# Patient Record
Sex: Female | Born: 1995 | Race: Black or African American | Hispanic: No | Marital: Single | Smoking: Never smoker
Health system: Southern US, Community
[De-identification: ages and names within clinical notes are randomized; demographics above are authoritative.]

---
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
## 2013-09-26 ENCOUNTER — Emergency Department (HOSPITAL_COMMUNITY)
Admission: EM | Admit: 2013-09-26 | Discharge: 2013-09-26 | Discharge status: Absent without leave | Payer: Medicaid Other | Source: Home / Self Care

## 2014-10-15 ENCOUNTER — Emergency Department
Admit: 2014-10-15 | Disposition: A | Discharge status: Home / Self Care | Payer: Self-pay | Admitting: Emergency Medicine

## 2014-12-22 ENCOUNTER — Encounter: Payer: Self-pay | Admitting: Emergency Medicine

## 2014-12-22 ENCOUNTER — Other Ambulatory Visit: Payer: Self-pay

## 2014-12-22 DIAGNOSIS — Z720 Tobacco use: Secondary | ICD-10-CM | POA: Insufficient documentation

## 2014-12-22 DIAGNOSIS — Z3202 Encounter for pregnancy test, result negative: Secondary | ICD-10-CM | POA: Insufficient documentation

## 2014-12-22 DIAGNOSIS — N12 Tubulo-interstitial nephritis, not specified as acute or chronic: Secondary | ICD-10-CM | POA: Insufficient documentation

## 2014-12-22 LAB — LIPASE, BLOOD: LIPASE: 24 U/L (ref 22–51)

## 2014-12-22 LAB — URINALYSIS COMPLETE WITH MICROSCOPIC (ARMC ONLY)
Bilirubin Urine: NEGATIVE
Glucose, UA: NEGATIVE mg/dL
NITRITE: NEGATIVE
PH: 5 (ref 5.0–8.0)
PROTEIN: 100 mg/dL — AB
SPECIFIC GRAVITY, URINE: 1.038 — AB (ref 1.005–1.030)

## 2014-12-22 LAB — COMPREHENSIVE METABOLIC PANEL
ALK PHOS: 59 U/L (ref 38–126)
ALT: 16 U/L (ref 14–54)
AST: 23 U/L (ref 15–41)
Albumin: 4 g/dL (ref 3.5–5.0)
Anion gap: 11 (ref 5–15)
BUN: 13 mg/dL (ref 6–20)
CO2: 25 mmol/L (ref 22–32)
Calcium: 9 mg/dL (ref 8.9–10.3)
Chloride: 99 mmol/L — ABNORMAL LOW (ref 101–111)
Creatinine, Ser: 0.77 mg/dL (ref 0.44–1.00)
GFR calc non Af Amer: >60 mL/min (ref >60)
GLUCOSE: 124 mg/dL — AB (ref 65–99)
Potassium: 3.1 mmol/L — ABNORMAL LOW (ref 3.5–5.1)
SODIUM: 135 mmol/L (ref 135–145)
TOTAL PROTEIN: 7.9 g/dL (ref 6.5–8.1)
Total Bilirubin: 0.7 mg/dL (ref 0.3–1.2)

## 2014-12-22 LAB — CBC WITH DIFFERENTIAL/PLATELET
BASOS ABS: 0 10*3/uL (ref 0–0.1)
Basophils Relative: 0 %
Eosinophils Absolute: 0 10*3/uL (ref 0–0.7)
Eosinophils Relative: 0 %
HCT: 37.9 % (ref 35.0–47.0)
Hemoglobin: 12.8 g/dL (ref 12.0–16.0)
LYMPHS ABS: 0.6 10*3/uL — AB (ref 1.0–3.6)
LYMPHS PCT: 5 %
MCH: 30.2 pg (ref 26.0–34.0)
MCHC: 33.7 g/dL (ref 32.0–36.0)
MCV: 89.5 fL (ref 80.0–100.0)
Monocytes Absolute: 0.9 10*3/uL (ref 0.2–0.9)
Monocytes Relative: 7 %
NEUTROS PCT: 88 %
Neutro Abs: 11.4 10*3/uL — ABNORMAL HIGH (ref 1.4–6.5)
PLATELETS: 171 10*3/uL (ref 150–440)
RBC: 4.23 MIL/uL (ref 3.80–5.20)
RDW: 12.7 % (ref 11.5–14.5)
WBC: 13 10*3/uL — AB (ref 3.6–11.0)

## 2014-12-22 LAB — POCT PREGNANCY, URINE: Preg Test, Ur: NEGATIVE

## 2014-12-22 NOTE — ED Notes (Signed)
Pt arrived to the ED for complaints of lower abdominal pain x3 days. Pt states that she has been vomiting since today (3 times) and has no had a BM in 3 days. Pt reports taking Midol without relieve. Pt is AOx4 in no apparent distress.

## 2014-12-23 ENCOUNTER — Emergency Department
Admission: EM | Admit: 2014-12-23 | Discharge: 2014-12-23 | Disposition: A | Discharge status: Home / Self Care | Payer: Medicaid Other | Attending: Emergency Medicine | Admitting: Emergency Medicine

## 2014-12-23 DIAGNOSIS — N12 Tubulo-interstitial nephritis, not specified as acute or chronic: Secondary | ICD-10-CM

## 2014-12-23 MED ORDER — ONDANSETRON HCL 4 MG/2ML IJ SOLN
4.0000 mg | INTRAMUSCULAR | Status: AC
  Administered 2014-12-23: 4 mg via INTRAVENOUS

## 2014-12-23 MED ORDER — SODIUM CHLORIDE 0.9 % IV BOLUS (SEPSIS)
1000.0000 mL | INTRAVENOUS | Status: AC
  Administered 2014-12-23: 1000 mL via INTRAVENOUS

## 2014-12-23 MED ORDER — CEPHALEXIN 500 MG PO CAPS: 500.0000 mg | ORAL_CAPSULE | Freq: Three times a day (TID) | ORAL | Status: DC

## 2014-12-23 MED ORDER — CEFTRIAXONE SODIUM IN DEXTROSE 20 MG/ML IV SOLN
INTRAVENOUS | Status: AC
  Administered 2014-12-23: 1 g via INTRAVENOUS
  Filled 2014-12-23: qty 50

## 2014-12-23 MED ORDER — DOXYCYCLINE HYCLATE 100 MG PO TABS
100.0000 mg | ORAL_TABLET | Freq: Once | ORAL | Status: AC
  Administered 2014-12-23: 100 mg via ORAL

## 2014-12-23 MED ORDER — CEFTRIAXONE SODIUM IN DEXTROSE 20 MG/ML IV SOLN
1.0000 g | INTRAVENOUS | Status: AC
  Administered 2014-12-23: 1 g via INTRAVENOUS

## 2014-12-23 MED ORDER — ONDANSETRON HCL 4 MG PO TABS: ORAL_TABLET | ORAL | Status: DC

## 2014-12-23 MED ORDER — DOXYCYCLINE HYCLATE 50 MG PO CAPS: 100.0000 mg | ORAL_CAPSULE | Freq: Two times a day (BID) | ORAL | Status: DC

## 2014-12-23 MED ORDER — MORPHINE SULFATE 4 MG/ML IJ SOLN
4.0000 mg | Freq: Once | INTRAMUSCULAR | Status: AC
  Administered 2014-12-23: 4 mg via INTRAVENOUS

## 2014-12-23 MED ORDER — ONDANSETRON HCL 4 MG/2ML IJ SOLN
INTRAMUSCULAR | Status: AC
  Administered 2014-12-23: 4 mg via INTRAVENOUS
  Filled 2014-12-23: qty 2

## 2014-12-23 MED ORDER — MORPHINE SULFATE 4 MG/ML IJ SOLN
INTRAMUSCULAR | Status: AC
  Administered 2014-12-23: 4 mg via INTRAVENOUS
  Filled 2014-12-23: qty 1

## 2014-12-23 MED ORDER — DOXYCYCLINE HYCLATE 100 MG PO TABS
ORAL_TABLET | ORAL | Status: AC
  Administered 2014-12-23: 100 mg via ORAL
  Filled 2014-12-23: qty 1

## 2014-12-23 NOTE — ED Provider Notes (Addendum)
[facility] Emergency Department Provider Note  ____________________________________________  Time seen: Approximately 1:47 AM  I have reviewed the triage vital signs and the nursing notes.   HISTORY  Chief Complaint Abdominal Pain    HPI Margaret Crawford is a 19 y.o. female with no significant past medical history who reports gradual onset of 3 days of sharp lower abdominal pain that is persistent but waxing and waning in intensity.  She describes it as severe tonight.She developed nausea and vomiting today as well and believes she has had about 3 episodes of emesis.  Nothing makes it better and nothing makes it worse.  She has had some increased urinary frequency.  She denies chest pain, shortness of breath, fever/chills.  She has had some mild back pain associated with the abdominal pain.  She denies any vaginal discharge.  She states that she is sexually active with one partner and that they do not use condoms.   History reviewed. No pertinent past medical history.  There are no active problems to display for this patient.   History reviewed. No pertinent past surgical history.  Current Outpatient Rx  Name  Route  Sig  Dispense  Refill  . cephALEXin (KEFLEX) 500 MG capsule   Oral   Take 1 capsule (500 mg total) by mouth 3 (three) times daily.   36 capsule   0   . doxycycline (VIBRAMYCIN) 50 MG capsule   Oral   Take 2 capsules (100 mg total) by mouth 2 (two) times daily.   56 capsule   0   . ondansetron (ZOFRAN) 4 MG tablet      Take 1-2 tabs by mouth every 8 hours as needed for nausea/vomiting   30 tablet   0     Allergies Review of patient's allergies indicates no known allergies.  History reviewed. No pertinent family history.  Social History History  Substance Use Topics  . Smoking status: Light Tobacco Smoker  . Smokeless tobacco: Not on file  . Alcohol Use: No    Review of Systems Constitutional: No fever/chills Eyes:  No visual changes. ENT: No sore throat. Cardiovascular: Denies chest pain. Respiratory: Denies shortness of breath. Gastrointestinal: Severe sharp lower abdominal pain.  Several episodes of emesis.  No diarrhea.  No constipation. Genitourinary: Negative for dysuria.  No vaginal pain/discharge/bleeding Musculoskeletal: Negative for back pain. Skin: Negative for rash. Neurological: Negative for headaches, focal weakness or numbness.  10-point ROS otherwise negative.  ____________________________________________   PHYSICAL EXAM:  VITAL SIGNS: ED Triage Vitals  Enc Vitals Group     BP 12/22/14 2036 113/78 mmHg     Pulse Rate 12/22/14 2036 125     Resp 12/22/14 2036 20     Temp 12/22/14 2036 99.3 F (37.4 C)     Temp Source 12/22/14 2036 Oral     SpO2 12/22/14 2036 98 %     Weight 12/22/14 2036 99 lb 11.2 oz (45.224 kg)     Height 12/22/14 2036 5\' 4"  (1.626 m)     Head Cir --      Peak Flow --      Pain Score 12/22/14 2107 10     Pain Loc --      Pain Edu? --      Excl. in GC? --     Constitutional: Alert and oriented. Well appearing and in no acute distress.  Does appear uncomfortable Eyes: Conjunctivae are normal. PERRL. EOMI. Head: Atraumatic. Nose: No congestion/rhinnorhea. Mouth/Throat: Mucous membranes are  moist.  Oropharynx non-erythematous. Neck: No stridor.   Cardiovascular: Tachycardia, regular rhythm. Grossly normal heart sounds.  Good peripheral circulation. Respiratory: Normal respiratory effort.  No retractions. Lungs CTAB. Gastrointestinal: Soft and nontender. No distention. No abdominal bruits.  No CVA tenderness bilaterally, worse on right than left. Genitourinary: Deferred at the patient's request after explaining the reason for the exam (rule out STD) and the benefit of doing the exam today Musculoskeletal: No lower extremity tenderness nor edema.  No joint effusions. Neurologic:  Normal speech and language. No gross focal neurologic deficits are  appreciated. Speech is normal. Skin:  Skin is warm, dry and intact. No rash noted.   ____________________________________________   LABS (all labs ordered are listed, but only abnormal results are displayed)  Labs Reviewed  COMPREHENSIVE METABOLIC PANEL - Abnormal; Notable for the following:    Potassium 3.1 (*)    Chloride 99 (*)    Glucose, Bld 124 (*)    All other components within normal limits  CBC WITH DIFFERENTIAL/PLATELET - Abnormal; Notable for the following:    WBC 13.0 (*)    Neutro Abs 11.4 (*)    Lymphs Abs 0.6 (*)    All other components within normal limits  URINALYSIS COMPLETEWITH MICROSCOPIC ([facility] ONLY) - Abnormal; Notable for the following:    Color, Urine AMBER (*)    APPearance HAZY (*)    Ketones, ur 1+ (*)    Specific Gravity, Urine 1.038 (*)    Hgb urine dipstick 1+ (*)    Protein, ur 100 (*)    Leukocytes, UA 1+ (*)    Bacteria, UA RARE (*)    Squamous Epithelial / LPF 6-30 (*)    All other components within normal limits  URINE CULTURE  LIPASE, BLOOD  POC URINE PREG, ED  POCT PREGNANCY, URINE  Urine WBCs = TNTC ____________________________________________  EKG  ED ECG REPORT I, Madelaine Whipple, the attending physician, personally viewed and interpreted this ECG.  Date: 12/23/2014 EKG Time: 21:05 Rate: 106 Rhythm: Sinus tachycardia QRS Axis: normal Intervals: normal ST/T Wave abnormalities: Inverted T-wave in lead 3 and lead V3, otherwise unremarkable Conduction Disutrbances: none Narrative Interpretation: unremarkable  ____________________________________________  RADIOLOGY  Not indicated  ____________________________________________   PROCEDURES  Procedure(s) performed: None  Critical Care performed: No ____________________________________________   INITIAL IMPRESSION / ASSESSMENT AND PLAN / ED COURSE  Pertinent labs & imaging results that were available during my care of the patient were reviewed by me and considered in  my medical decision making (see chart for details).  The patient has a strongly positive urinalysis and her symptoms are consistent with a UTI/pyelonephritis.  Mild leukocytosis and tachycardia as well as mild CVA tenderness.  I explained the recommendation to do a pelvic exam to rule out STDs even though her urinalysis does strongly suggest her suprapubic pain and other symptoms being the result of a UTI.  She does not want to do the exam tonight though I did explain the benefit of doing the exam.  Since she has had no vaginal symptoms and we have an alternate diagnosis, I believe this is appropriate and I will respect her wishes.  The plan of care is for ceftriaxone 1 g IV and 2 L of IV fluids.  I will then reassess her and determine her appropriateness for discharge.  ----------------------------------------- 3:29 AM on 12/23/2014 -----------------------------------------  The patient's heart rate is down below 100 and she states that she feels better.  She is asking for something to drink.  I again discussed the pelvic exam with her and she again declines.  Given the risk of possible pelvic inflammatory disease, I will treat empirically with doxycycline 100 mg twice a day 14 days as well as treating with Keflex 500 mg 3 times a day 12 days for possible pyelonephritis.  I am also giving the patient a prescription for Zofran.  I gave her my usual and customary return precautions should she develop new symptoms or should she get worse. ____________________________________________  FINAL CLINICAL IMPRESSION(S) / ED DIAGNOSES  Final diagnoses:  Pyelonephritis      NEW MEDICATIONS STARTED DURING THIS VISIT:  New Prescriptions   CEPHALEXIN (KEFLEX) 500 MG CAPSULE    Take 1 capsule (500 mg total) by mouth 3 (three) times daily.   DOXYCYCLINE (VIBRAMYCIN) 50 MG CAPSULE    Take 2 capsules (100 mg total) by mouth 2 (two) times daily.   ONDANSETRON (ZOFRAN) 4 MG TABLET    Take 1-2 tabs by mouth  every 8 hours as needed for nausea/vomiting     Loleta Rose, MD 12/23/14 [phone]

## 2014-12-23 NOTE — Discharge Instructions (Signed)
Your workup today suggests that you have a urinary tract infection (UTI) which have may spread to your kidneys.  Please take your antibiotics as prescribed FOR THE FULL COURSE OF TREATMENT (until all the pills are gone) and over-the-counter pain medication (Tylenol or Motrin) as needed, but no more than recommended on the label instructions.  Drink PLENTY of fluids.  Use the prescribed Zofran as needed for nausea/vomiting.  Call your regular doctor to schedule the next available appointment to follow up on todays ED visit, or return immediately to the ED if your pain worsens, you have decreased urine production, develop fever, persistent vomiting, or other symptoms that concern you.   Pyelonephritis, Adult Pyelonephritis is a kidney infection. A kidney infection can happen quickly, or it can last for a long time. HOME CARE   Take your medicine (antibiotics) as told. Finish it even if you start to feel better.  Keep all doctor visits as told.  Drink enough fluids to keep your pee (urine) clear or pale yellow.  Only take medicine as told by your doctor. GET HELP RIGHT AWAY IF:   You have a fever or lasting symptoms for more than 2-3 days.  You have a fever and your symptoms suddenly get worse.  You cannot take your medicine or drink fluids as told.  You have chills and shaking.  You feel very weak or pass out (faint).  You do not feel better after 2 days. MAKE SURE YOU:  Understand these instructions.  Will watch your condition.  Will get help right away if you are not doing well or get worse. Document Released: 07/24/2004 Document Revised: 12/16/2011 Document Reviewed: 12/04/2010 [facility] Patient Information 2015 [place], [place]. This information is not intended to replace advice given to you by your health care provider. Make sure you discuss any questions you have with your health care provider.

## 2014-12-24 LAB — URINE CULTURE
Culture: 10000
Special Requests: NORMAL

## 2017-10-06 ENCOUNTER — Emergency Department
Admission: EM | Admit: 2017-10-06 | Discharge: 2017-10-06 | Disposition: A | Discharge status: Home / Self Care | Payer: Self-pay | Attending: Student in an Organized Health Care Education/Training Program | Admitting: Student in an Organized Health Care Education/Training Program

## 2017-10-06 ENCOUNTER — Encounter: Payer: Self-pay | Admitting: Emergency Medicine

## 2017-10-06 ENCOUNTER — Other Ambulatory Visit: Payer: Self-pay

## 2017-10-06 DIAGNOSIS — F17200 Nicotine dependence, unspecified, uncomplicated: Secondary | ICD-10-CM | POA: Insufficient documentation

## 2017-10-06 DIAGNOSIS — R197 Diarrhea, unspecified: Secondary | ICD-10-CM | POA: Insufficient documentation

## 2017-10-06 DIAGNOSIS — Z79899 Other long term (current) drug therapy: Secondary | ICD-10-CM | POA: Insufficient documentation

## 2017-10-06 DIAGNOSIS — R112 Nausea with vomiting, unspecified: Secondary | ICD-10-CM | POA: Insufficient documentation

## 2017-10-06 LAB — CBC
HEMATOCRIT: 42.5 % (ref 35.0–47.0)
Hemoglobin: 14 g/dL (ref 12.0–16.0)
MCH: 29.9 pg (ref 26.0–34.0)
MCHC: 33 g/dL (ref 32.0–36.0)
MCV: 90.4 fL (ref 80.0–100.0)
Platelets: 255 10*3/uL (ref 150–440)
RBC: 4.7 MIL/uL (ref 3.80–5.20)
RDW: 14.6 % — ABNORMAL HIGH (ref 11.5–14.5)
WBC: 10.7 10*3/uL (ref 3.6–11.0)

## 2017-10-06 LAB — COMPREHENSIVE METABOLIC PANEL
ALBUMIN: 4.9 g/dL (ref 3.5–5.0)
ALT: 16 U/L (ref 14–54)
AST: 25 U/L (ref 15–41)
Alkaline Phosphatase: 74 U/L (ref 38–126)
Anion gap: 6 (ref 5–15)
BUN: 21 mg/dL — ABNORMAL HIGH (ref 6–20)
CHLORIDE: 104 mmol/L (ref 101–111)
CO2: 27 mmol/L (ref 22–32)
Calcium: 9.3 mg/dL (ref 8.9–10.3)
Creatinine, Ser: 0.96 mg/dL (ref 0.44–1.00)
GFR calc Af Amer: >60 mL/min (ref >60)
GFR calc non Af Amer: >60 mL/min (ref >60)
GLUCOSE: 89 mg/dL (ref 65–99)
POTASSIUM: 3.6 mmol/L (ref 3.5–5.1)
SODIUM: 137 mmol/L (ref 135–145)
Total Bilirubin: 0.7 mg/dL (ref 0.3–1.2)
Total Protein: 8.7 g/dL — ABNORMAL HIGH (ref 6.5–8.1)

## 2017-10-06 LAB — LIPASE, BLOOD: Lipase: 27 U/L (ref 11–51)

## 2017-10-06 LAB — POCT PREGNANCY, URINE: PREG TEST UR: NEGATIVE

## 2017-10-06 LAB — URINALYSIS, COMPLETE (UACMP) WITH MICROSCOPIC
Bilirubin Urine: NEGATIVE
Glucose, UA: NEGATIVE mg/dL
Ketones, ur: 20 mg/dL — AB
Nitrite: NEGATIVE
Protein, ur: NEGATIVE mg/dL
SPECIFIC GRAVITY, URINE: 1.028 (ref 1.005–1.030)
pH: 5 (ref 5.0–8.0)

## 2017-10-06 MED ORDER — NITROFURANTOIN MONOHYD MACRO 100 MG PO CAPS: 100.0000 mg | ORAL_CAPSULE | Freq: Two times a day (BID) | ORAL | 0 refills | Status: AC

## 2017-10-06 MED ORDER — ONDANSETRON 4 MG PO TBDP
4.0000 mg | ORAL_TABLET | Freq: Once | ORAL | Status: AC
  Administered 2017-10-06: 4 mg via ORAL
  Filled 2017-10-06: qty 1

## 2017-10-06 MED ORDER — ONDANSETRON HCL 4 MG PO TABS: 4.0000 mg | ORAL_TABLET | Freq: Every day | ORAL | 0 refills | Status: DC | PRN

## 2017-10-06 NOTE — ED Triage Notes (Signed)
Pt to ED via POV with c/o vomiting and diarrhea that started this am. PT ambulatory, VSS.

## 2017-10-06 NOTE — ED Provider Notes (Signed)
[facility] Emergency Department Provider Note    First MD Initiated Contact with Patient 10/06/17 1816     (approximate)  I have reviewed the triage vital signs and the nursing notes.   HISTORY  Chief Complaint Emesis    HPI Margaret Crawford is a 22 y.o. female presents with chief complaint of 1 day of nausea vomiting diarrhea.  No blood hematemesis or melena.  Has had decreased appetite today.  No abdominal pain at this time.  Has not tried anything this afternoon.  Denies any dysuria, increased frequency or hematuria.  No vaginal discharge or pelvic pain.  No known sick contacts.  Has not had any episodes of vomiting since presented to the ER.  History reviewed. No pertinent past medical history. No family history on file. History reviewed. No pertinent surgical history. There are no active problems to display for this patient.     Prior to Admission medications   Medication Sig Start Date End Date Taking? Authorizing Provider  cephALEXin (KEFLEX) 500 MG capsule Take 1 capsule (500 mg total) by mouth 3 (three) times daily. 12/23/14   Loleta Rose, MD  doxycycline (VIBRAMYCIN) 50 MG capsule Take 2 capsules (100 mg total) by mouth 2 (two) times daily. 12/23/14   Loleta Rose, MD  ondansetron [facility]) 4 MG tablet Take 1-2 tabs by mouth every 8 hours as needed for nausea/vomiting 12/23/14   Loleta Rose, MD    Allergies Patient has no known allergies.    Social History Social History   Tobacco Use  . Smoking status: Light Tobacco Smoker  . Smokeless tobacco: Never Used  Substance Use Topics  . Alcohol use: Yes    Comment: occas  . Drug use: No    Review of Systems Patient denies headaches, rhinorrhea, blurry vision, numbness, shortness of breath, chest pain, edema, cough, abdominal pain, nausea, vomiting, diarrhea, dysuria, fevers, rashes or hallucinations unless otherwise stated above in  HPI. ____________________________________________   PHYSICAL EXAM:  VITAL SIGNS: Vitals:   10/06/17 1607  BP: 118/80  Pulse: 86  Resp: 16  Temp: 98.3 F (36.8 C)  SpO2: 98%    Constitutional: Alert and oriented. Well appearing and in no acute distress. Eyes: Conjunctivae are normal.  Head: Atraumatic. Nose: No congestion/rhinnorhea. Mouth/Throat: Mucous membranes are moist.   Neck: Painless ROM.  Cardiovascular:   Good peripheral circulation. Respiratory: Normal respiratory effort.  No retractions.  Gastrointestinal: Soft and nontender.  Musculoskeletal: No lower extremity tenderness .  No joint effusions. Neurologic:  Normal speech and language. No gross focal neurologic deficits are appreciated.  Skin:  Skin is warm, dry and intact. No rash noted. Psychiatric: Mood and affect are normal. Speech and behavior are normal.  ____________________________________________   LABS (all labs ordered are listed, but only abnormal results are displayed)  Results for orders placed or performed during the hospital encounter of 10/06/17 (from the past 24 hour(s))  Lipase, blood     Status: None   Collection Time: 10/06/17  4:10 PM  Result Value Ref Range   Lipase 27 11 - 51 U/L  Comprehensive metabolic panel     Status: Abnormal   Collection Time: 10/06/17  4:10 PM  Result Value Ref Range   Sodium 137 135 - 145 mmol/L   Potassium 3.6 3.5 - 5.1 mmol/L   Chloride 104 101 - 111 mmol/L   CO2 27 22 - 32 mmol/L   Glucose, Bld 89 65 - 99 mg/dL   BUN 21 (H) 6 -  20 mg/dL   Creatinine, Ser 1.61 0.44 - 1.00 mg/dL   Calcium 9.3 8.9 - 09.6 mg/dL   Total Protein 8.7 (H) 6.5 - 8.1 g/dL   Albumin 4.9 3.5 - 5.0 g/dL   AST 25 15 - 41 U/L   ALT 16 14 - 54 U/L   Alkaline Phosphatase 74 38 - 126 U/L   Total Bilirubin 0.7 0.3 - 1.2 mg/dL   GFR calc non Af Amer >60 >60 mL/min   GFR calc Af Amer >60 >60 mL/min   Anion gap 6 5 - 15  CBC     Status: Abnormal   Collection Time: 10/06/17  4:10 PM   Result Value Ref Range   WBC 10.7 3.6 - 11.0 K/uL   RBC 4.70 3.80 - 5.20 MIL/uL   Hemoglobin 14.0 12.0 - 16.0 g/dL   HCT 04.5 40.9 - 81.1 %   MCV 90.4 80.0 - 100.0 fL   MCH 29.9 26.0 - 34.0 pg   MCHC 33.0 32.0 - 36.0 g/dL   RDW 91.4 (H) 78.2 - 95.6 %   Platelets 255 150 - 440 K/uL  Urinalysis, Complete w Microscopic     Status: Abnormal   Collection Time: 10/06/17  4:10 PM  Result Value Ref Range   Color, Urine YELLOW (A) YELLOW   APPearance HAZY (A) CLEAR   Specific Gravity, Urine 1.028 1.005 - 1.030   pH 5.0 5.0 - 8.0   Glucose, UA NEGATIVE NEGATIVE mg/dL   Hgb urine dipstick MODERATE (A) NEGATIVE   Bilirubin Urine NEGATIVE NEGATIVE   Ketones, ur 20 (A) NEGATIVE mg/dL   Protein, ur NEGATIVE NEGATIVE mg/dL   Nitrite NEGATIVE NEGATIVE   Leukocytes, UA TRACE (A) NEGATIVE   RBC / HPF 0-5 0 - 5 RBC/hpf   WBC, UA 6-30 0 - 5 WBC/hpf   Bacteria, UA RARE (A) NONE SEEN   Squamous Epithelial / LPF 0-5 (A) NONE SEEN   Mucus PRESENT   Pregnancy, urine POC     Status: None   Collection Time: 10/06/17  4:19 PM  Result Value Ref Range   Preg Test, Ur NEGATIVE NEGATIVE   ____________________________________________  ____________________________________________  RADIOLOGY   ____________________________________________   PROCEDURES  Procedure(s) performed:  Procedures    Critical Care performed: no ____________________________________________   INITIAL IMPRESSION / ASSESSMENT AND PLAN / ED COURSE  Pertinent labs & imaging results that were available during my care of the patient were reviewed by me and considered in my medical decision making (see chart for details).  DDX: Enteritis, gastritis, UTI, Pilo, pregnancy dehydration  Margaret Crawford is a 22 y.o. who presents to the ED with symptoms as described above.  Patient well-appearing and nontoxic.  Have offered IV fluids but patient declined this.  Blood work is otherwise reassuring.  Possible bacteria in urine but  also possibly contaminant.  Certainly not clinically consistent with pyelonephritis.  Pregnancy is negative.  Patient already requesting discharge home.  At this point it would reasonable give her workand can rest.  Discussed need to return to the ER in 12-24 hours if symptoms worsen or for any additional questions or concerns.  Have discussed with the patient and available family all diagnostics and treatments performed thus far and all questions were answered to the best of my ability. The patient demonstrates understanding and agreement with plan.       ____________________________________________   FINAL CLINICAL IMPRESSION(S) / ED DIAGNOSES  Final diagnoses:  Nausea vomiting and diarrhea  NEW MEDICATIONS STARTED DURING THIS VISIT:  New Prescriptions   No medications on file     Note:  This document was prepared using Dragon voice recognition software and may include unintentional dictation errors.     Willy Eddyobinson, Ellinor Test, MD 10/06/17 [phone]

## 2018-04-04 ENCOUNTER — Emergency Department
Admission: EM | Admit: 2018-04-04 | Discharge: 2018-04-04 | Disposition: A | Discharge status: Home / Self Care | Payer: Self-pay | Attending: Emergency Medicine | Admitting: Emergency Medicine

## 2018-04-04 ENCOUNTER — Emergency Department: Payer: Self-pay

## 2018-04-04 DIAGNOSIS — M79671 Pain in right foot: Secondary | ICD-10-CM | POA: Insufficient documentation

## 2018-04-04 DIAGNOSIS — F17200 Nicotine dependence, unspecified, uncomplicated: Secondary | ICD-10-CM | POA: Insufficient documentation

## 2018-04-04 MED ORDER — NAPROXEN 375 MG PO TABS: 375.0000 mg | ORAL_TABLET | Freq: Two times a day (BID) | ORAL | 0 refills | Status: DC

## 2018-04-04 NOTE — ED Notes (Signed)

## 2018-04-04 NOTE — ED Triage Notes (Signed)
Pt presents via POV c/o right foot pain x3 days. Denies injury. Ambulatory to triage with slight limp.

## 2018-04-04 NOTE — Discharge Instructions (Addendum)
You have been diagnosed with acute right foot pain.  The x-ray of your right foot is negative for fracture or any acute findings.  I have provided you with a prescription for Naproxen to take twice a day for the next week.  Please take with food.  Avoid all other over-the-counter NSAIDs.  Ice may be helpful to aid in reducing pain.  Follow-up with the [facility] as needed if pain persist or worsens.

## 2018-04-04 NOTE — ED Provider Notes (Signed)
[facility] Emergency Department Provider Note ____________________________________________  Time seen: 1530  I have reviewed the triage vital signs and the nursing notes.  HISTORY  Chief Complaint  Foot Pain   HPI Margaret Crawford is a 22 y.o. female presents to the ER today with complaint of right foot pain.  She reports this started 2 to 3 days ago.  She reports the pain came out of nowhere.  She describes the pain as a sharp and stabbing.  The pain is worse with palpation and ambulation.  She has not noticed any swelling or bruising over the area.  She denies any injury to the area.  She denies numbness, tingling or weakness of the right lower extremity.  She has not tried anything over-the-counter for this.  History reviewed. No pertinent past medical history.  There are no active problems to display for this patient.   History reviewed. No pertinent surgical history.  Prior to Admission medications   Medication Sig Start Date End Date Taking? Authorizing Provider  naproxen (NAPROSYN) 375 MG tablet Take 1 tablet (375 mg total) by mouth 2 (two) times daily with a meal. 04/04/18   Dayanara Sherrill, Salvadore Oxford, NP    Allergies Patient has no known allergies.  History reviewed. No pertinent family history.  Social History Social History   Tobacco Use  . Smoking status: Light Tobacco Smoker  . Smokeless tobacco: Never Used  Substance Use Topics  . Alcohol use: Yes    Comment: occas  . Drug use: No    Review of Systems  Constitutional: Negative for fever. Musculoskeletal: Positive for right foot pain.  Negative for knee or ankle pain. Skin: Negative for swelling, bruising or redness. Neurological: Negative for focal weakness, tingling or numbness. ____________________________________________  PHYSICAL EXAM:  VITAL SIGNS: ED Triage Vitals  Enc Vitals Group     BP 04/04/18 1433 123/71     Pulse Rate 04/04/18 1433 84     Resp 04/04/18 1433 14   Temp 04/04/18 1433 98.1 F (36.7 C)     Temp Source 04/04/18 1433 Oral     SpO2 04/04/18 1433 100 %     Weight 04/04/18 1434 107 lb (48.5 kg)     Height --      Head Circumference --      Peak Flow --      Pain Score 04/04/18 1434 10     Pain Loc --      Pain Edu? --      Excl. in GC? --     Constitutional: Alert and oriented. Well appearing and in no distress. Cardiovascular: Pedal pulses 2+ bilaterally Musculoskeletal: Normal flexion, extension and rotation of the right ankle.  Normal flexion and extension of the toes.  No pain with resistance to flexion and extension of the toes.  No pain with palpation of the Achilles or bilateral malleoli.  Pain with palpation over the second cuneiform bone.  No joint swelling noted.  Gait slow with a slight limp. Neurologic: Sensation intact to LE.  No gross focal neurologic deficits are appreciated. Skin: Feet are cool, but dry and intact.  No redness, swelling or bruising noted. ____________________________________________   RADIOLOGY   Imaging Orders     DG Foot Complete Right IMPRESSION: Normal exam.  ____________________________________________   INITIAL IMPRESSION / ASSESSMENT AND PLAN / ED COURSE  Acute Right Foot Pain:  Xray negative for acute findings Will treat for inflammation of 2nd cuneiform RX provided for Naproxen 375 mg BID  x 1 week Pt request ACE wrap- applied Ice for 10 minutes BID  Work note provided to be off work for 3 days  ____________________________________________  FINAL CLINICAL IMPRESSION(S) / ED DIAGNOSES  Final diagnoses:  Acute pain of right foot      Lorre Munroe, NP 04/04/18 1547    Arnaldo Natal, MD 04/05/18 [phone]

## 2019-03-15 ENCOUNTER — Emergency Department
Admission: EM | Admit: 2019-03-15 | Discharge: 2019-03-15 | Disposition: A | Discharge status: Home / Self Care | Payer: Self-pay | Attending: Emergency Medicine | Admitting: Emergency Medicine

## 2019-03-15 ENCOUNTER — Other Ambulatory Visit: Payer: Self-pay

## 2019-03-15 DIAGNOSIS — F17200 Nicotine dependence, unspecified, uncomplicated: Secondary | ICD-10-CM | POA: Insufficient documentation

## 2019-03-15 DIAGNOSIS — J029 Acute pharyngitis, unspecified: Secondary | ICD-10-CM

## 2019-03-15 DIAGNOSIS — R070 Pain in throat: Secondary | ICD-10-CM | POA: Insufficient documentation

## 2019-03-15 DIAGNOSIS — Z20828 Contact with and (suspected) exposure to other viral communicable diseases: Secondary | ICD-10-CM | POA: Insufficient documentation

## 2019-03-15 MED ORDER — MAGIC MOUTHWASH W/LIDOCAINE: 5.0000 mL | Freq: Four times a day (QID) | ORAL | 0 refills | Status: DC

## 2019-03-15 NOTE — ED Triage Notes (Addendum)
Presents with sore throat for couple of days   No fever  States her empolyer sent her home d/t sore throat  States she needed to be cleared

## 2019-03-15 NOTE — ED Provider Notes (Signed)
[facility] Emergency Department Provider Note  ____________________________________________  Time seen: Approximately 3:38 PM  I have reviewed the triage vital signs and the nursing notes.   HISTORY  Chief Complaint Sore Throat    HPI Margaret Crawford is a 23 y.o. female who presents the emergency department for evaluation of sore throat.  Patient reports that she recently has "head colds."  Patient started with sore throat yesterday.   Patient reports that she has had no associated nasal congestion, fevers or chills, coughing, shortness of breath.  Patient reports that her work is requiring her to have a COVID-19 test and given her sore throat status.  No medications prior to arrival.  No other complaints at this time.        No past medical history on file.  There are no active problems to display for this patient.   No past surgical history on file.  Prior to Admission medications   Medication Sig Start Date End Date Taking? Authorizing Provider  magic mouthwash w/lidocaine SOLN Take 5 mLs by mouth 4 (four) times daily. 03/15/19   Tamre Cass, Delorise RoyalsJonathan D, PA-C    Allergies Patient has no known allergies.  No family history on file.  Social History Social History   Tobacco Use  . Smoking status: Light Tobacco Smoker  . Smokeless tobacco: Never Used  Substance Use Topics  . Alcohol use: Yes    Comment: occas  . Drug use: No     Review of Systems  Constitutional: No fever/chills Eyes: No visual changes. No discharge ENT: Positive for sore throat Cardiovascular: no chest pain. Respiratory: no cough. No SOB. Gastrointestinal: No abdominal pain.  No nausea, no vomiting.  No diarrhea.  No constipation. Musculoskeletal: Negative for musculoskeletal pain. Skin: Negative for rash, abrasions, lacerations, ecchymosis. Neurological: Negative for headaches, focal weakness or numbness. 10-point ROS otherwise  negative.  ____________________________________________   PHYSICAL EXAM:  VITAL SIGNS: ED Triage Vitals  Enc Vitals Group     BP 03/15/19 1533 114/80     Pulse Rate 03/15/19 1533 90     Resp 03/15/19 1533 17     Temp 03/15/19 1533 (!) 97.5 F (36.4 C)     Temp Source 03/15/19 1533 Oral     SpO2 03/15/19 1533 100 %     Weight 03/15/19 1527 116 lb (52.6 kg)     Height 03/15/19 1527 5\' 4"  (1.626 m)     Head Circumference --      Peak Flow --      Pain Score 03/15/19 1531 4     Pain Loc --      Pain Edu? --      Excl. in GC? --      Constitutional: Alert and oriented. Well appearing and in no acute distress. Eyes: Conjunctivae are normal. PERRL. EOMI. Head: Atraumatic. ENT:      Ears: EACs and TMs unremarkable bilaterally      Nose: No congestion/rhinnorhea.      Mouth/Throat: Mucous membranes are moist.  Oropharynx is nonerythematous and nonedematous.  Uvula is midline.  No exudates appreciated.  Sub-lingular region is soft. Neck: No stridor.  Neck is supple full range of motion Hematological/Lymphatic/Immunilogical: No cervical lymphadenopathy. Cardiovascular: Normal rate, regular rhythm. Normal S1 and S2.  Good peripheral circulation. Respiratory: Normal respiratory effort without tachypnea or retractions. Lungs CTAB. Good air entry to the bases with no decreased or absent breath sounds. Musculoskeletal: Full range of motion to all extremities. No gross deformities appreciated.  Neurologic:  Normal speech and language. No gross focal neurologic deficits are appreciated.  Skin:  Skin is warm, dry and intact. No rash noted. Psychiatric: Mood and affect are normal. Speech and behavior are normal. Patient exhibits appropriate insight and judgement.   ____________________________________________   LABS (all labs ordered are listed, but only abnormal results are displayed)  Labs Reviewed  NOVEL CORONAVIRUS, NAA (HOSP ORDER, SEND-OUT TO REF LAB; TAT 18-24 HRS)    ____________________________________________  EKG   ____________________________________________  RADIOLOGY   No results found.  ____________________________________________    PROCEDURES  Procedure(s) performed:    Procedures    Medications - No data to display   ____________________________________________   INITIAL IMPRESSION / ASSESSMENT AND PLAN / ED COURSE  Pertinent labs & imaging results that were available during my care of the patient were reviewed by me and considered in my medical decision making (see chart for details).  Review of the [place] CSRS was performed in accordance of the [place] prior to dispensing any controlled drugs.           Patient's diagnosis is consistent with sore throat.  Patient presented to the emergency department complaining of sore throat x2 days.  No other symptoms, however patient's work requested that she have a COVID-19 test.  At this time, differential included viral URI, strep, COVID-19.  Patient only meets 1 out of 5 Cent[facility] criteria, absence of cough.  Very low suspicion at this time for strep pharyngitis.  Most likely viral URI but I will test for COVID-19 at this time.  Follow-up with primary care as needed..  Patient is given ED precautions to return to the ED for any worsening or new symptoms.     ____________________________________________  FINAL CLINICAL IMPRESSION(S) / ED DIAGNOSES  Final diagnoses:  Sore throat      NEW MEDICATIONS STARTED DURING THIS VISIT:  ED Discharge Orders         Ordered    magic mouthwash w/lidocaine SOLN  4 times daily    Note to Pharmacy: Dispense in a 1/1/1 ratio. Use lidocaine, diphenhydramine, prednisolone   03/15/19 1559              This chart was dictated using voice recognition software/Dragon. Despite best efforts to proofread, errors can occur which can change the meaning. Any change was purely unintentional.    Darletta Moll, PA-C 03/15/19  1600    Nena Polio, MD 03/15/19 2358

## 2019-03-16 LAB — NOVEL CORONAVIRUS, NAA (HOSP ORDER, SEND-OUT TO REF LAB; TAT 18-24 HRS): SARS-CoV-2, NAA: NOT DETECTED

## 2019-03-18 ENCOUNTER — Telehealth: Payer: Self-pay | Admitting: General Practice

## 2019-03-18 NOTE — Telephone Encounter (Signed)
Negative COVID results given. Patient results "NOT Detected." Caller expressed understanding. ° °

## 2019-04-26 ENCOUNTER — Emergency Department: Payer: Self-pay

## 2019-04-26 ENCOUNTER — Other Ambulatory Visit: Payer: Self-pay

## 2019-04-26 ENCOUNTER — Emergency Department
Admission: EM | Admit: 2019-04-26 | Discharge: 2019-04-26 | Disposition: A | Discharge status: Home / Self Care | Payer: Self-pay | Attending: Emergency Medicine | Admitting: Emergency Medicine

## 2019-04-26 ENCOUNTER — Encounter: Payer: Self-pay | Admitting: Emergency Medicine

## 2019-04-26 DIAGNOSIS — F17200 Nicotine dependence, unspecified, uncomplicated: Secondary | ICD-10-CM | POA: Insufficient documentation

## 2019-04-26 DIAGNOSIS — K2970 Gastritis, unspecified, without bleeding: Secondary | ICD-10-CM | POA: Insufficient documentation

## 2019-04-26 DIAGNOSIS — R079 Chest pain, unspecified: Secondary | ICD-10-CM | POA: Insufficient documentation

## 2019-04-26 LAB — BASIC METABOLIC PANEL
Anion gap: 7 (ref 5–15)
BUN: 17 mg/dL (ref 6–20)
CO2: 27 mmol/L (ref 22–32)
Calcium: 9.4 mg/dL (ref 8.9–10.3)
Chloride: 104 mmol/L (ref 98–111)
Creatinine, Ser: 0.74 mg/dL (ref 0.44–1.00)
GFR calc Af Amer: >60 mL/min (ref >60)
GFR calc non Af Amer: >60 mL/min (ref >60)
Glucose, Bld: 84 mg/dL (ref 70–99)
Potassium: 3.9 mmol/L (ref 3.5–5.1)
Sodium: 138 mmol/L (ref 135–145)

## 2019-04-26 LAB — CBC
HCT: 38.7 % (ref 36.0–46.0)
Hemoglobin: 12.9 g/dL (ref 12.0–15.0)
MCH: 30.4 pg (ref 26.0–34.0)
MCHC: 33.3 g/dL (ref 30.0–36.0)
MCV: 91.3 fL (ref 80.0–100.0)
Platelets: 285 10*3/uL (ref 150–400)
RBC: 4.24 MIL/uL (ref 3.87–5.11)
RDW: 12.6 % (ref 11.5–15.5)
WBC: 5.2 10*3/uL (ref 4.0–10.5)
nRBC: 0 % (ref 0.0–0.2)

## 2019-04-26 MED ORDER — FAMOTIDINE 20 MG PO TABS: 20.0000 mg | ORAL_TABLET | Freq: Every day | ORAL | 1 refills | Status: DC

## 2019-04-26 NOTE — Discharge Instructions (Addendum)
Please seek medical attention for any high fevers, chest pain, shortness of breath, change in behavior, persistent vomiting, bloody stool or any other new or concerning symptoms.  

## 2019-04-26 NOTE — ED Provider Notes (Signed)
[facility] Emergency Department Provider Note  ____________________________________________   I have reviewed the triage vital signs and the nursing notes.   HISTORY  Chief Complaint Chest Pain   History limited by: Not Limited   HPI Margaret Crawford is a 23 y.o. female who presents to the emergency department today because of concern for chest pain.  The patient states that the pain started today.  Located in the center part of her chest.  It was intermittent.  She did not identify anything that made the pain better or worse.  Says that the pain was accompanied by some slight shortness of breath.  She denies any nausea or vomiting.  She does state that she drank a lot of orange juice yesterday.  Denies any fevers.  Records reviewed. Per medical record review patient has a history of recent ER visit for sore throat.   History reviewed. No pertinent past medical history.  There are no active problems to display for this patient.   History reviewed. No pertinent surgical history.  Prior to Admission medications   Medication Sig Start Date End Date Taking? Authorizing Provider  magic mouthwash w/lidocaine SOLN Take 5 mLs by mouth 4 (four) times daily. 03/15/19   Cuthriell, Charline Bills, PA-C    Allergies Patient has no known allergies.  No family history on file.  Social History Social History   Tobacco Use  . Smoking status: Light Tobacco Smoker  . Smokeless tobacco: Never Used  Substance Use Topics  . Alcohol use: Yes    Comment: occas  . Drug use: No    Review of Systems Constitutional: No fever/chills Eyes: No visual changes. ENT: No sore throat. Cardiovascular: Positive for chest pain. Respiratory: Positive for shortness of breath. Gastrointestinal: No abdominal pain.  No nausea, no vomiting.  No diarrhea.   Genitourinary: Negative for dysuria. Musculoskeletal: Negative for back pain. Skin: Negative for rash. Neurological: Negative  for headaches, focal weakness or numbness.  ____________________________________________   PHYSICAL EXAM:  VITAL SIGNS: ED Triage Vitals  Enc Vitals Group     BP 04/26/19 1153 122/63     Pulse Rate 04/26/19 1153 83     Resp 04/26/19 1153 18     Temp 04/26/19 1153 97.8 F (36.6 C)     Temp Source 04/26/19 1153 Oral     SpO2 04/26/19 1153 100 %     Weight 04/26/19 1154 110 lb (49.9 kg)     Height 04/26/19 1154 5\' 4"  (1.626 m)     Head Circumference --      Peak Flow --      Pain Score 04/26/19 1158 2   Constitutional: Alert and oriented.  Eyes: Conjunctivae are normal.  ENT      Head: Normocephalic and atraumatic.      Nose: No congestion/rhinnorhea.      Mouth/Throat: Mucous membranes are moist.      Neck: No stridor. Hematological/Lymphatic/Immunilogical: No cervical lymphadenopathy. Cardiovascular: Normal rate, regular rhythm.  No murmurs, rubs, or gallops.  Respiratory: Normal respiratory effort without tachypnea nor retractions. Breath sounds are clear and equal bilaterally. No wheezes/rales/rhonchi. Gastrointestinal: Soft and non tender. No rebound. No guarding.  Genitourinary: Deferred Musculoskeletal: Normal range of motion in all extremities. No lower extremity edema. Neurologic:  Normal speech and language. No gross focal neurologic deficits are appreciated.  Skin:  Skin is warm, dry and intact. No rash noted. Psychiatric: Mood and affect are normal. Speech and behavior are normal. Patient exhibits appropriate insight and  judgment.  ____________________________________________    LABS (pertinent positives/negatives)  BMP wnl CBC wbc 5.2, hgb 12.9, plt 285 ____________________________________________   EKG  I, Phineas Semen, attending physician, personally viewed and interpreted this EKG  EKG Time: 1152 Rate: 84 Rhythm: normal sinus rhythm Axis: normal Intervals: qtc 441 QRS: narrow ST changes: no st elevation Impression: normal  ekg   ____________________________________________    RADIOLOGY  CXR No acute disease ____________________________________________   PROCEDURES  Procedures  ____________________________________________   INITIAL IMPRESSION / ASSESSMENT AND PLAN / ED COURSE  Pertinent labs & imaging results that were available during my care of the patient were reviewed by me and considered in my medical decision making (see chart for details).   Patient presented to the emergency department today because of concerns for chest pain.  Patient does state that she drank a lot of orange juice yesterday.  Patient's physical exam is benign.  Blood work without concerning leukocytosis.  EKG without concerning changes for ischemia.  At this time I doubt ACS.  Doubt PE.  Do think likely patient suffering from acid reflux given the large amount of orange juice drank yesterday.  Will plan on discharging with pepcid.   ____________________________________________   FINAL CLINICAL IMPRESSION(S) / ED DIAGNOSES  Final diagnoses:  Nonspecific chest pain  Gastritis, presence of bleeding unspecified, unspecified chronicity, unspecified gastritis type     Note: This dictation was prepared with Dragon dictation. Any transcriptional errors that result from this process are unintentional     Phineas Semen, MD 04/26/19 1342

## 2019-04-26 NOTE — ED Notes (Signed)
ED Provider at bedside. 

## 2019-04-26 NOTE — ED Triage Notes (Signed)
Pt reports pain to her mid chest this am that was achy in nature and non radiating. Pt reports feels better now and denies SOB, fever, cough, congestion or other sx's.

## 2019-10-24 ENCOUNTER — Other Ambulatory Visit: Payer: Self-pay

## 2019-12-27 ENCOUNTER — Other Ambulatory Visit: Payer: Self-pay

## 2019-12-27 ENCOUNTER — Emergency Department
Admission: EM | Admit: 2019-12-27 | Discharge: 2019-12-27 | Disposition: A | Discharge status: Home / Self Care | Payer: Self-pay | Attending: Emergency Medicine | Admitting: Emergency Medicine

## 2019-12-27 ENCOUNTER — Emergency Department: Payer: Self-pay

## 2019-12-27 ENCOUNTER — Encounter: Payer: Self-pay | Admitting: Emergency Medicine

## 2019-12-27 DIAGNOSIS — F17200 Nicotine dependence, unspecified, uncomplicated: Secondary | ICD-10-CM | POA: Insufficient documentation

## 2019-12-27 DIAGNOSIS — R519 Headache, unspecified: Secondary | ICD-10-CM | POA: Insufficient documentation

## 2019-12-27 LAB — POCT PREGNANCY, URINE: Preg Test, Ur: NEGATIVE

## 2019-12-27 MED ORDER — IBUPROFEN 600 MG PO TABS: 600.0000 mg | ORAL_TABLET | Freq: Four times a day (QID) | ORAL | 0 refills | Status: DC | PRN

## 2019-12-27 MED ORDER — IBUPROFEN 600 MG PO TABS
600.0000 mg | ORAL_TABLET | Freq: Once | ORAL | Status: AC
  Administered 2019-12-27: 600 mg via ORAL
  Filled 2019-12-27: qty 1

## 2019-12-27 NOTE — ED Triage Notes (Signed)
Pt states she woke up with a HA. Pt reports pain is across forehead. Pt reports has not taken anything for it. Pt states she always gets headaches sometimes but has never been checked for them. Pt denies nasal congestion but is sniffling in triage. Reports feels nauseated sometimes. Denies other concerns.

## 2019-12-27 NOTE — ED Provider Notes (Signed)
[facility] Emergency Department Provider Note  ____________________________________________  Time seen: Approximately 8:45 AM  I have reviewed the triage vital signs and the nursing notes.   HISTORY  Chief Complaint Headache    HPI Margaret Crawford is a 24 y.o. female that presents to the emergency department for evaluation of headache for several months.  Patient states that she has been getting chronic daily headaches.  She usually takes Tylenol, which improved symptoms.  This morning, she did not take any medication and decided to come to the emergency department.  Headache is to the front of her head.  Occasionally they make her nauseous.  No history of headaches or migraines.  Patient drinks a lot of soda daily and some water.  No recent illness.  No fevers, neck pain, visual changes.   History reviewed. No pertinent past medical history.  There are no problems to display for this patient.   History reviewed. No pertinent surgical history.  Prior to Admission medications   Medication Sig Start Date End Date Taking? Authorizing Provider  ibuprofen (ADVIL) 600 MG tablet Take 1 tablet (600 mg total) by mouth every 6 (six) hours as needed. 12/27/19   Enid Derry, PA-C    Allergies Patient has no known allergies.  No family history on file.  Social History Social History   Tobacco Use  . Smoking status: Light Tobacco Smoker  . Smokeless tobacco: Never Used  Substance Use Topics  . Alcohol use: Yes    Comment: occas  . Drug use: No     Review of Systems  Constitutional: No fever/chills Respiratory: No SOB. Gastrointestinal: No abdominal pain.  No nausea, no vomiting.  Musculoskeletal: Negative for musculoskeletal pain. Skin: Negative for rash, abrasions, lacerations, ecchymosis. Neurological: Negative for numbness or tingling. Positive for headache.   ____________________________________________   PHYSICAL EXAM:  VITAL  SIGNS: ED Triage Vitals  Enc Vitals Group     BP 12/27/19 0804 123/88     Pulse Rate 12/27/19 0804 86     Resp 12/27/19 0804 20     Temp 12/27/19 0804 (!) 97.1 F (36.2 C)     Temp Source 12/27/19 0804 Oral     SpO2 12/27/19 0804 99 %     Weight 12/27/19 0801 120 lb (54.4 kg)     Height 12/27/19 0801 5\' 4"  (1.626 m)     Head Circumference --      Peak Flow --      Pain Score 12/27/19 0801 8     Pain Loc --      Pain Edu? --      Excl. in GC? --      Constitutional: Alert and oriented. Well appearing and in no acute distress. Eyes: Conjunctivae are normal. PERRL. EOMI. Head: Atraumatic. ENT:      Ears:      Nose: No congestion/rhinnorhea.      Mouth/Throat: Mucous membranes are moist.  Neck: No stridor.No cervical spine tenderness to palpation. Cardiovascular: Normal rate, regular rhythm.  Good peripheral circulation. Respiratory: Normal respiratory effort without tachypnea or retractions. Lungs CTAB. Good air entry to the bases with no decreased or absent breath sounds. Gastrointestinal: Bowel sounds 4 quadrants. Soft and nontender to palpation. No guarding or rigidity. No palpable masses. No distention.  Musculoskeletal: Full range of motion to all extremities. No gross deformities appreciated. Neurologic:  Normal speech and language. No gross focal neurologic deficits are appreciated.  Skin:  Skin is warm, dry and intact. No rash  noted. Psychiatric: Mood and affect are normal. Speech and behavior are normal. Patient exhibits appropriate insight and judgement.   ____________________________________________   LABS (all labs ordered are listed, but only abnormal results are displayed)  Labs Reviewed  POC URINE PREG, ED  POCT PREGNANCY, URINE   ____________________________________________  EKG   ____________________________________________  RADIOLOGY Lexine Baton, personally viewed and evaluated these images (plain radiographs) as part of my medical decision  making, as well as reviewing the written report by the radiologist.  CT Head Wo Contrast  Result Date: 12/27/2019 CLINICAL DATA:  Chronic headache with nausea EXAM: CT HEAD WITHOUT CONTRAST TECHNIQUE: Contiguous axial images were obtained from the base of the skull through the vertex without intravenous contrast. COMPARISON:  Report from 1997, no images. FINDINGS: Brain: No evidence of acute infarction, hemorrhage, hydrocephalus, extra-axial collection or mass lesion/mass effect. Vascular: No hyperdense vessel or unexpected calcification. Skull: Normal. Negative for fracture or focal lesion. Sinuses/Orbits: Visualized paranasal sinuses and orbits are remarkable only for scattered ethmoid opacification on the LEFT. Other: None. IMPRESSION: 1. No acute intracranial abnormality. 2. Mild mucosal thickening of ethmoid sinuses. Electronically Signed   By: Donzetta Kohut M.D.   On: 12/27/2019 09:20    ____________________________________________    PROCEDURES  Procedure(s) performed:    Procedures    Medications  ibuprofen (ADVIL) tablet 600 mg (has no administration in time range)     ____________________________________________   INITIAL IMPRESSION / ASSESSMENT AND PLAN / ED COURSE  Pertinent labs & imaging results that were available during my care of the patient were reviewed by me and considered in my medical decision making (see chart for details).  Review of the [place] CSRS was performed in accordance of the [facility] prior to dispensing any controlled drugs.     Patient presented to emergency department for evaluation of daily headaches.  Vital signs and exam are reassuring.  CT head is negative for acute abnormalities.  Patient declines any IM pain medications today.  She prefers to take ibuprofen or Tylenol.  Headaches resolved with these medications.  Patient will be discharged home with prescriptions for Motrin.  Patient is to follow up with neurology as directed. Patient is given ED  precautions to return to the ED for any worsening or new symptoms.  Margaret Crawford was evaluated in Emergency Department on 12/27/2019 for the symptoms described in the history of present illness. She was evaluated in the context of the global COVID-19 pandemic, which necessitated consideration that the patient might be at risk for infection with the SARS-CoV-2 virus that causes COVID-19. Institutional protocols and algorithms that pertain to the evaluation of patients at risk for COVID-19 are in a state of rapid change based on information released by regulatory bodies including the CDC and federal and state organizations. These policies and algorithms were followed during the patient's care in the ED.   ____________________________________________  FINAL CLINICAL IMPRESSION(S) / ED DIAGNOSES  Final diagnoses:  Acute nonintractable headache, unspecified headache type      NEW MEDICATIONS STARTED DURING THIS VISIT:  ED Discharge Orders         Ordered    ibuprofen (ADVIL) 600 MG tablet  Every 6 hours PRN     Discontinue  Reprint     12/27/19 1124              This chart was dictated using voice recognition software/Dragon. Despite best efforts to proofread, errors can occur which can change the meaning. Any change was purely  unintentional.    Enid Derry, PA-C 12/27/19 1541    Jene Every, MD 12/29/19 2348

## 2019-12-27 NOTE — ED Notes (Signed)
See triage note  Presents with frontal headache   Denies any photo phobia  Positive nausea  No fever  States has not taken anything for h/a today

## 2020-09-11 ENCOUNTER — Ambulatory Visit: Payer: Self-pay

## 2020-09-19 ENCOUNTER — Other Ambulatory Visit: Payer: Self-pay

## 2020-09-19 ENCOUNTER — Ambulatory Visit: Payer: Self-pay | Admitting: Advanced Practice Midwife

## 2020-09-19 ENCOUNTER — Encounter: Payer: Self-pay | Admitting: Advanced Practice Midwife

## 2020-09-19 DIAGNOSIS — Z789 Other specified health status: Secondary | ICD-10-CM

## 2020-09-19 DIAGNOSIS — Z113 Encounter for screening for infections with a predominantly sexual mode of transmission: Secondary | ICD-10-CM

## 2020-09-19 DIAGNOSIS — Z7289 Other problems related to lifestyle: Secondary | ICD-10-CM

## 2020-09-19 LAB — WET PREP FOR TRICH, YEAST, CLUE
Trichomonas Exam: POSITIVE — AB
Yeast Exam: NEGATIVE

## 2020-09-19 MED ORDER — METRONIDAZOLE 500 MG PO TABS: 500.0000 mg | ORAL_TABLET | Freq: Two times a day (BID) | ORAL | 0 refills | Status: AC

## 2020-09-19 NOTE — Addendum Note (Signed)
Addended by: Tawny Hopping A on: 09/19/2020 04:50 PM   Modules accepted: Orders

## 2020-09-19 NOTE — Progress Notes (Signed)
[facility] Department STI clinic/screening visit  Subjective:  Margaret Crawford is a 25 y.o. SBF nullip nonsmoker female being seen today for an STI screening visit. The patient reports they do not have symptoms.  Patient reports that they do desire a pregnancy in the next year.   They reported they are not interested in discussing contraception today.  Patient's last menstrual period was 09/17/2020 (exact date).   Patient has the following medical conditions:  There are no problems to display for this patient.   Chief Complaint  Patient presents with  . SEXUALLY TRANSMITTED DISEASE    Screening    HPI  Patient reports LMP 09/17/20.  Last ETOH yesterday (5 shots Petrone) daily.  Last sex 09/14/20 without condom; with current partner x 2 wks.  Last HIV test per patient/review of record was pt can't remember Patient reports last pap was pt can't remember  See flowsheet for further details and programmatic requirements.    The following portions of the patient's history were reviewed and updated as appropriate: allergies, current medications, past medical history, past social history, past surgical history and problem list.  Objective:  There were no vitals filed for this visit.  Physical Exam Vitals and nursing note reviewed.  Constitutional:      Appearance: Normal appearance. She is normal weight.  HENT:     Head: Normocephalic and atraumatic.     Mouth/Throat:     Mouth: Mucous membranes are moist.     Pharynx: Oropharynx is clear. No oropharyngeal exudate or posterior oropharyngeal erythema.  Eyes:     Conjunctiva/sclera: Conjunctivae normal.  Pulmonary:     Effort: Pulmonary effort is normal.  Chest:  Breasts:     Right: No axillary adenopathy or supraclavicular adenopathy.     Left: No axillary adenopathy or supraclavicular adenopathy.    Abdominal:     General: Abdomen is flat.     Palpations: Abdomen is soft. There is no mass.     Tenderness:  There is no abdominal tenderness. There is no rebound.     Comments: Good tone, soft without tenderness  Genitourinary:    General: Normal vulva.     Exam position: Lithotomy position.     Pubic Area: No rash or pubic lice.      Labia:        Right: No rash or lesion.        Left: No rash or lesion.      Vagina: Normal. No vaginal discharge (light red/pink menses blood), erythema, bleeding or lesions.     Cervix: Normal.     Uterus: Normal.      Adnexa: Right adnexa normal and left adnexa normal.     Rectum: Normal.     Comments: Pt removed tampon immediately before exam Lymphadenopathy:     Head:     Right side of head: No preauricular or posterior auricular adenopathy.     Left side of head: No preauricular or posterior auricular adenopathy.     Cervical: No cervical adenopathy.     Upper Body:     Right upper body: No supraclavicular or axillary adenopathy.     Left upper body: No supraclavicular or axillary adenopathy.     Lower Body: No right inguinal adenopathy. No left inguinal adenopathy.  Skin:    General: Skin is warm and dry.     Findings: No rash.  Neurological:     Mental Status: She is alert and oriented to person, place, and  time.      Assessment and Plan:  Margaret Crawford is a 25 y.o. female presenting to the [facility] Department for STI screening  1. Screening examination for venereal disease Treat wet mount per standing orders Immunization nurse consult Needs Pap - WET PREP FOR TRICH, YEAST, CLUE - Chlamydia/Gonorrhea [facility] - Gonococcus culture     No follow-ups on file.  No future appointments.  Alberteen Spindle, CNM

## 2020-09-19 NOTE — Progress Notes (Signed)
[employer] results reviewed by provider Hazle Coca, CNM. Patient treated for Trich per standing orders. Tawny Hopping, RN

## 2020-09-19 NOTE — Progress Notes (Signed)
Here today for STD screening. Declines bloodwork. Mead Slane, RN ? ?

## 2020-09-24 LAB — GONOCOCCUS CULTURE

## 2020-09-27 ENCOUNTER — Other Ambulatory Visit: Payer: Self-pay

## 2020-09-27 DIAGNOSIS — A549 Gonococcal infection, unspecified: Secondary | ICD-10-CM

## 2020-09-27 MED ORDER — CEFTRIAXONE SODIUM 500 MG IJ SOLR
500.0000 mg | Freq: Once | INTRAMUSCULAR | Status: AC
  Administered 2020-09-27: 500 mg via INTRAMUSCULAR

## 2020-09-27 NOTE — Progress Notes (Signed)
Treated for gonorrhea today with Ceftriaxone 500 mg IM once per SO Dr. Kirtland Bouchard. [place]. Tolerated well. Stayed for 20 min observation after injection without difficulty. Jerel Shepherd, RN

## 2020-11-02 ENCOUNTER — Emergency Department
Admission: EM | Admit: 2020-11-02 | Discharge: 2020-11-02 | Disposition: A | Discharge status: Home / Self Care | Payer: Self-pay | Attending: Emergency Medicine | Admitting: Emergency Medicine

## 2020-11-02 ENCOUNTER — Other Ambulatory Visit: Payer: Self-pay

## 2020-11-02 ENCOUNTER — Encounter: Payer: Self-pay | Admitting: Emergency Medicine

## 2020-11-02 DIAGNOSIS — R197 Diarrhea, unspecified: Secondary | ICD-10-CM | POA: Insufficient documentation

## 2020-11-02 DIAGNOSIS — F17200 Nicotine dependence, unspecified, uncomplicated: Secondary | ICD-10-CM | POA: Insufficient documentation

## 2020-11-02 DIAGNOSIS — R112 Nausea with vomiting, unspecified: Secondary | ICD-10-CM | POA: Insufficient documentation

## 2020-11-02 DIAGNOSIS — R1084 Generalized abdominal pain: Secondary | ICD-10-CM | POA: Insufficient documentation

## 2020-11-02 LAB — COMPREHENSIVE METABOLIC PANEL
ALT: 13 U/L (ref 0–44)
AST: 20 U/L (ref 15–41)
Albumin: 4.4 g/dL (ref 3.5–5.0)
Alkaline Phosphatase: 54 U/L (ref 38–126)
Anion gap: 11 (ref 5–15)
BUN: 15 mg/dL (ref 6–20)
CO2: 23 mmol/L (ref 22–32)
Calcium: 9.2 mg/dL (ref 8.9–10.3)
Chloride: 102 mmol/L (ref 98–111)
Creatinine, Ser: 0.7 mg/dL (ref 0.44–1.00)
GFR, Estimated: >60 mL/min (ref >60)
Glucose, Bld: 66 mg/dL — ABNORMAL LOW (ref 70–99)
Potassium: 3.6 mmol/L (ref 3.5–5.1)
Sodium: 136 mmol/L (ref 135–145)
Total Bilirubin: 1 mg/dL (ref 0.3–1.2)
Total Protein: 7.9 g/dL (ref 6.5–8.1)

## 2020-11-02 LAB — CBC
HCT: 42.6 % (ref 36.0–46.0)
Hemoglobin: 14.3 g/dL (ref 12.0–15.0)
MCH: 29.9 pg (ref 26.0–34.0)
MCHC: 33.6 g/dL (ref 30.0–36.0)
MCV: 88.9 fL (ref 80.0–100.0)
Platelets: 251 10*3/uL (ref 150–400)
RBC: 4.79 MIL/uL (ref 3.87–5.11)
RDW: 12.7 % (ref 11.5–15.5)
WBC: 5.4 10*3/uL (ref 4.0–10.5)
nRBC: 0 % (ref 0.0–0.2)

## 2020-11-02 LAB — URINALYSIS, COMPLETE (UACMP) WITH MICROSCOPIC
Bilirubin Urine: NEGATIVE
Glucose, UA: NEGATIVE mg/dL
Ketones, ur: 80 mg/dL — AB
Leukocytes,Ua: NEGATIVE
Nitrite: NEGATIVE
Protein, ur: NEGATIVE mg/dL
Specific Gravity, Urine: 1.028 (ref 1.005–1.030)
pH: 5 (ref 5.0–8.0)

## 2020-11-02 LAB — LIPASE, BLOOD: Lipase: 27 U/L (ref 11–51)

## 2020-11-02 LAB — POC URINE PREG, ED: Preg Test, Ur: NEGATIVE

## 2020-11-02 LAB — CBG MONITORING, ED: Glucose-Capillary: 78 mg/dL (ref 70–99)

## 2020-11-02 MED ORDER — ONDANSETRON HCL 4 MG/2ML IJ SOLN
4.0000 mg | Freq: Once | INTRAMUSCULAR | Status: AC
  Administered 2020-11-02: 4 mg via INTRAVENOUS
  Filled 2020-11-02: qty 2

## 2020-11-02 MED ORDER — ONDANSETRON 4 MG PO TBDP: 4.0000 mg | ORAL_TABLET | Freq: Three times a day (TID) | ORAL | 0 refills | Status: DC | PRN

## 2020-11-02 MED ORDER — SODIUM CHLORIDE 0.9 % IV BOLUS
1000.0000 mL | Freq: Once | INTRAVENOUS | Status: AC
  Administered 2020-11-02: 1000 mL via INTRAVENOUS

## 2020-11-02 MED ORDER — KETOROLAC TROMETHAMINE 30 MG/ML IJ SOLN
15.0000 mg | Freq: Once | INTRAMUSCULAR | Status: AC
  Administered 2020-11-02: 15 mg via INTRAVENOUS
  Filled 2020-11-02: qty 1

## 2020-11-02 NOTE — ED Notes (Signed)
See triage note  Presents with stomach pain for couple of days   Had started with n/v  And then developed diarrhea today  No fever

## 2020-11-02 NOTE — ED Provider Notes (Signed)
[facility] Emergency Department Provider Note  ____________________________________________   Event Date/Time   First MD Initiated Contact with Patient 11/02/20 1636     (approximate)  I have reviewed the triage vital signs and the nursing notes.   HISTORY  Chief Complaint Abdominal Pain and Emesis   HPI Margaret Crawford is a 25 y.o. female who reports to the ER for evaluation of nausea, vomiting, abdominal pain and diarrhea that began yesterday. Her symptoms began with nausea and vomiting and today the diarrhea started. She reports associated diffuse intermittent abdominal pain. Denies any fevers, vaginal discharge, dysuria, or other associated symptoms. She does report testing positive for gonorrhea and trichomonas last month but states she and her partner both completed treatment for this and she denies any concern for STD at this time.         History reviewed. No pertinent past medical history.  Patient Active Problem List   Diagnosis Date Noted  . Alcohol use daily 09/19/2020    History reviewed. No pertinent surgical history.  Prior to Admission medications   Medication Sig Start Date End Date Taking? Authorizing Provider  ondansetron (ZOFRAN ODT) 4 MG disintegrating tablet Take 1 tablet (4 mg total) by mouth every 8 (eight) hours as needed for nausea or vomiting. 11/02/20  Yes Lucy Chris, PA  ibuprofen (ADVIL) 600 MG tablet Take 1 tablet (600 mg total) by mouth every 6 (six) hours as needed. 12/27/19   Enid Derry, PA-C    Allergies Patient has no known allergies.  No family history on file.  Social History Social History   Tobacco Use  . Smoking status: Light Tobacco Smoker  . Smokeless tobacco: Never Used  Substance Use Topics  . Alcohol use: Yes    Alcohol/week: 5.0 standard drinks    Types: 5 Shots of liquor per week    Comment: 5 shots Petrone daily  . Drug use: No    Review of Systems Constitutional: No  fever/chills Eyes: No visual changes. ENT: No sore throat. Cardiovascular: Denies chest pain. Respiratory: Denies shortness of breath. Gastrointestinal: + abdominal pain.  + nausea, + vomiting.  + diarrhea.  No constipation. Genitourinary: Negative for dysuria. Musculoskeletal: Negative for back pain. Skin: Negative for rash. Neurological: Negative for headaches, focal weakness or numbness.  ____________________________________________   PHYSICAL EXAM:  VITAL SIGNS: ED Triage Vitals  Enc Vitals Group     BP 11/02/20 1638 111/81     Pulse Rate 11/02/20 1638 93     Resp 11/02/20 1638 17     Temp 11/02/20 1638 98.3 F (36.8 C)     Temp Source 11/02/20 1638 Oral     SpO2 11/02/20 1638 100 %     Weight 11/02/20 1639 117 lb (53.1 kg)     Height 11/02/20 1639 5\' 4"  (1.626 m)     Head Circumference --      Peak Flow --      Pain Score 11/02/20 1638 10     Pain Loc --      Pain Edu? --      Excl. in GC? --    Constitutional: Alert and oriented. Well appearing and in no acute distress. Eyes: Conjunctivae are normal. PERRL. EOMI. Head: Atraumatic. Nose: No congestion/rhinnorhea. Mouth/Throat: Mucous membranes are moist.  Oropharynx non-erythematous. Neck: No stridor.   Cardiovascular: Normal rate, regular rhythm. Grossly normal heart sounds.  Good peripheral circulation. Respiratory: Normal respiratory effort.  No retractions. Lungs CTAB. Gastrointestinal: BSx4 quadrants, Soft  and nontender. No distention. No abdominal bruits. No CVA tenderness. Musculoskeletal: No lower extremity tenderness nor edema.  No joint effusions. Neurologic:  Normal speech and language. No gross focal neurologic deficits are appreciated. No gait instability. Skin:  Skin is warm, dry and intact. No rash noted. Psychiatric: Mood and affect are normal. Speech and behavior are normal.  ____________________________________________   LABS (all labs ordered are listed, but only abnormal results are  displayed)  Labs Reviewed  COMPREHENSIVE METABOLIC PANEL - Abnormal; Notable for the following components:      Result Value   Glucose, Bld 66 (*)    All other components within normal limits  URINALYSIS, COMPLETE (UACMP) WITH MICROSCOPIC - Abnormal; Notable for the following components:   Color, Urine YELLOW (*)    APPearance HAZY (*)    Hgb urine dipstick MODERATE (*)    Ketones, ur 80 (*)    Bacteria, UA RARE (*)    All other components within normal limits  RESP PANEL BY RT-PCR (FLU A&B, COVID) ARPGX2  LIPASE, BLOOD  CBC  POC URINE PREG, ED  CBG MONITORING, ED   ____________________________________________   INITIAL IMPRESSION / ASSESSMENT AND PLAN / ED COURSE  As part of my medical decision making, I reviewed the following data within the electronic MEDICAL RECORD NUMBER Nursing notes reviewed and incorporated, Labs reviewed, Evaluated by EM attending Dr. Fuller Plan and Notes from prior ED visits        Patient is a 25 year old female who reports to the emergency department for evaluation of diffuse intermittent abdominal pain associated with nausea and vomiting yesterday and then diarrhea today.  See HPI for further details.  The patient was also seen and personally examined by Dr. Alfred Levins who agrees with the below work-up and plan of care.  In triage, patient is afebrile, not tachycardic and has otherwise normal vital signs.  On physical exam, she is nontender to palpation in any specific area of the abdomen and has no CVA tenderness.  She is overall very well-appearing.  Laboratory evaluation was obtained and demonstrates a mildly decreased glucose of 66 as well as moderate hemoglobin and 80 ketones.  Suspect that this is from the fact that the patient states she has been unable to tolerate p.o. for the last 2 days.  In the interim while awaiting labs, patient was given Zofran as well as 1 L of fluids and Toradol.  Upon reexamination, patient is tolerating p.o. at this time and reports  significant improvement in her symptoms.  Will discharge with a short course of Zofran with strict return precautions to come back if she has any worsening, fever or other changes in her symptoms.  Patient is amenable with this plan and she stable this time for outpatient management.     ____________________________________________   FINAL CLINICAL IMPRESSION(S) / ED DIAGNOSES  Final diagnoses:  Nausea vomiting and diarrhea     ED Discharge Orders         Ordered    ondansetron (ZOFRAN ODT) 4 MG disintegrating tablet  Every 8 hours PRN        11/02/20 1940          *Please note:  Michille Mcelrath Ossa was evaluated in Emergency Department on 11/02/2020 for the symptoms described in the history of present illness. She was evaluated in the context of the global COVID-19 pandemic, which necessitated consideration that the patient might be at risk for infection with the SARS-CoV-2 virus that causes COVID-19. Institutional protocols and algorithms  that pertain to the evaluation of patients at risk for COVID-19 are in a state of rapid change based on information released by regulatory bodies including the CDC and federal and state organizations. These policies and algorithms were followed during the patient's care in the ED.  Some ED evaluations and interventions may be delayed as a result of limited staffing during and the pandemic.*   Note:  This document was prepared using Dragon voice recognition software and may include unintentional dictation errors.   Lucy Chris, PA 11/02/20 2212    Concha Se, MD 11/03/20 [id number]

## 2020-11-02 NOTE — Discharge Instructions (Signed)
Return to the ER if you develop any worsening or are unable to tolerate oral intake. Otherwise, follow up with primary care.

## 2020-11-02 NOTE — ED Triage Notes (Signed)
Pt comes into the ED via POV c/o abdominal pain, N/V/D that started yesterday.  Pt ambulatory to triage and in NAD at this time.  Pt explains that the diarrhea started today which is new.

## 2021-05-09 ENCOUNTER — Other Ambulatory Visit: Payer: Self-pay

## 2021-05-09 ENCOUNTER — Emergency Department
Admission: EM | Admit: 2021-05-09 | Discharge: 2021-05-09 | Disposition: A | Discharge status: Home / Self Care | Payer: Self-pay | Attending: Emergency Medicine | Admitting: Emergency Medicine

## 2021-05-09 ENCOUNTER — Encounter: Payer: Self-pay | Admitting: Emergency Medicine

## 2021-05-09 DIAGNOSIS — N390 Urinary tract infection, site not specified: Secondary | ICD-10-CM | POA: Insufficient documentation

## 2021-05-09 DIAGNOSIS — R000 Tachycardia, unspecified: Secondary | ICD-10-CM | POA: Insufficient documentation

## 2021-05-09 DIAGNOSIS — F17200 Nicotine dependence, unspecified, uncomplicated: Secondary | ICD-10-CM | POA: Insufficient documentation

## 2021-05-09 DIAGNOSIS — R112 Nausea with vomiting, unspecified: Secondary | ICD-10-CM

## 2021-05-09 LAB — URINALYSIS, ROUTINE W REFLEX MICROSCOPIC
Bilirubin Urine: NEGATIVE
Glucose, UA: NEGATIVE mg/dL
Hgb urine dipstick: NEGATIVE
Ketones, ur: 5 mg/dL — AB
Nitrite: NEGATIVE
Protein, ur: NEGATIVE mg/dL
Specific Gravity, Urine: 1.024 (ref 1.005–1.030)
pH: 5 (ref 5.0–8.0)

## 2021-05-09 LAB — POC URINE PREG, ED: Preg Test, Ur: NEGATIVE

## 2021-05-09 MED ORDER — NITROFURANTOIN MONOHYD MACRO 100 MG PO CAPS: 100.0000 mg | ORAL_CAPSULE | Freq: Two times a day (BID) | ORAL | 0 refills | Status: AC

## 2021-05-09 MED ORDER — ONDANSETRON 4 MG PO TBDP: 4.0000 mg | ORAL_TABLET | Freq: Three times a day (TID) | ORAL | 0 refills | Status: DC | PRN

## 2021-05-09 MED ORDER — ONDANSETRON 4 MG PO TBDP
4.0000 mg | ORAL_TABLET | Freq: Once | ORAL | Status: AC
  Administered 2021-05-09: 4 mg via ORAL
  Filled 2021-05-09: qty 1

## 2021-05-09 NOTE — ED Triage Notes (Addendum)
Pt comes into the ED via POV c/o nausea and body aches.  Pt states she woke up vomiting and feeling weak and having body aches.  Denies any fevers that she is aware of.  Pt has even and unlabored respirations and is in NAD.

## 2021-05-09 NOTE — ED Notes (Signed)
See triage note  presents with some n/v this am  states she felt fine yesterday   woke up with sx's this am   vomited times 2-3 this am  positive body aches  afebrile on arrival

## 2021-05-09 NOTE — ED Provider Notes (Signed)
[facility] Emergency Department Provider Note  ____________________________________________   Event Date/Time   First MD Initiated Contact with Patient 05/09/21 [phone]     (approximate)  I have reviewed the triage vital signs and the nursing notes.   HISTORY  Chief Complaint Emesis and Generalized Body Aches    HPI Margaret Crawford is a 25 y.o. female presents emergency department complaining of vomiting that started this morning.  States feels tired and weak.  No fever or chills.  No burning with urination.  Unsure of pregnancy.  Denies abdominal pain or diarrhea  History reviewed. No pertinent past medical history.  Patient Active Problem List   Diagnosis Date Noted   Alcohol use daily 09/19/2020    History reviewed. No pertinent surgical history.  Prior to Admission medications   Medication Sig Start Date End Date Taking? Authorizing Provider  nitrofurantoin, macrocrystal-monohydrate, (MACROBID) 100 MG capsule Take 1 capsule (100 mg total) by mouth 2 (two) times daily for 7 days. 05/09/21 05/16/21 Yes Alexandros Ewan, Roselyn Bering, PA-C  ondansetron (ZOFRAN-ODT) 4 MG disintegrating tablet Take 1 tablet (4 mg total) by mouth every 8 (eight) hours as needed. 05/09/21  Yes Faythe Ghee, PA-C    Allergies Patient has no known allergies.  History reviewed. No pertinent family history.  Social History Social History   Tobacco Use   Smoking status: Light Smoker   Smokeless tobacco: Never  Substance Use Topics   Alcohol use: Yes    Alcohol/week: 5.0 standard drinks    Types: 5 Shots of liquor per week    Comment: 5 shots Petrone daily   Drug use: No    Review of Systems  Constitutional: No fever/chills Eyes: No visual changes. ENT: No sore throat. Respiratory: Denies cough Cardiovascular: Denies chest pain Gastrointestinal: Denies abdominal pain Genitourinary: Negative for dysuria. Musculoskeletal: Negative for back pain. Skin: Negative for  rash. Psychiatric: no mood changes,     ____________________________________________   PHYSICAL EXAM:  VITAL SIGNS: ED Triage Vitals  Enc Vitals Group     BP 05/09/21 0725 120/76     Pulse Rate 05/09/21 0725 (!) 103     Resp 05/09/21 0725 17     Temp 05/09/21 0725 98.1 F (36.7 C)     Temp Source 05/09/21 0725 Oral     SpO2 05/09/21 0725 99 %     Weight 05/09/21 0723 117 lb 1 oz (53.1 kg)     Height 05/09/21 0723 5\' 4"  (1.626 m)     Head Circumference --      Peak Flow --      Pain Score 05/09/21 0722 9     Pain Loc --      Pain Edu? --      Excl. in GC? --     Constitutional: Alert and oriented. Well appearing and in no acute distress. Eyes: Conjunctivae are normal.  Head: Atraumatic. Nose: No congestion/rhinnorhea. Mouth/Throat: Mucous membranes are moist.   Neck:  supple no lymphadenopathy noted Cardiovascular: Normal rate, regular rhythm.  Respiratory: Normal respiratory effort.  No retractions   Abd: soft nontender bs normal all 4 quad GU: deferred Musculoskeletal: FROM all extremities, warm and well perfused Neurologic:  Normal speech and language.  Skin:  Skin is warm, dry and intact. No rash noted. Psychiatric: Mood and affect are normal. Speech and behavior are normal.  ____________________________________________   LABS (all labs ordered are listed, but only abnormal results are displayed)  Labs Reviewed  URINALYSIS, ROUTINE W REFLEX  MICROSCOPIC - Abnormal; Notable for the following components:      Result Value   Color, Urine YELLOW (*)    APPearance CLOUDY (*)    Ketones, ur 5 (*)    Leukocytes,Ua LARGE (*)    Bacteria, UA RARE (*)    All other components within normal limits  POC URINE PREG, ED   ____________________________________________   ____________________________________________  RADIOLOGY    ____________________________________________   PROCEDURES  Procedure(s) performed:  No  Procedures    ____________________________________________   INITIAL IMPRESSION / ASSESSMENT AND PLAN / ED COURSE  Pertinent labs & imaging results that were available during my care of the patient were reviewed by me and considered in my medical decision making (see chart for details).   Patient is a 25 year old female presents with vomiting.  See HPI.  Patient does smoke marijuana daily.  Physical exam shows her to be very stable.  She is afebrile.  Pulse is slightly elevated at 103.  We will do a POC pregnancy and urinalysis.  Give her Zofran ODT.  If she is better with the medication will discharge with prescription for Zofran ODT.  Patient is refusing a COVID/flu swab.  I told her this was okay as I do not feel a change her treatment plan at this time.  Patient's POC pregnancy is negative, urinalysis does show large amount of leuks.  Will treat empirically for UTI.  She is given a prescription for Macrobid and Zofran ODT.  She is to follow-up with her regular doctor if not improving to 3 days.  Return if worsening.  Margaret Crawford was evaluated in Emergency Department on 05/09/2021 for the symptoms described in the history of present illness. She was evaluated in the context of the global COVID-19 pandemic, which necessitated consideration that the patient might be at risk for infection with the SARS-CoV-2 virus that causes COVID-19. Institutional protocols and algorithms that pertain to the evaluation of patients at risk for COVID-19 are in a state of rapid change based on information released by regulatory bodies including the CDC and federal and state organizations. These policies and algorithms were followed during the patient's care in the ED.    As part of my medical decision making, I reviewed the following data within the electronic MEDICAL RECORD NUMBER Nursing notes reviewed and incorporated, Labs reviewed , Old chart reviewed, Notes from prior ED visits, and [place] Controlled  Substance Database  ____________________________________________   FINAL CLINICAL IMPRESSION(S) / ED DIAGNOSES  Final diagnoses:  Acute UTI  Nausea and vomiting, unspecified vomiting type      NEW MEDICATIONS STARTED DURING THIS VISIT:  New Prescriptions   NITROFURANTOIN, MACROCRYSTAL-MONOHYDRATE, (MACROBID) 100 MG CAPSULE    Take 1 capsule (100 mg total) by mouth 2 (two) times daily for 7 days.   ONDANSETRON (ZOFRAN-ODT) 4 MG DISINTEGRATING TABLET    Take 1 tablet (4 mg total) by mouth every 8 (eight) hours as needed.     Note:  This document was prepared using Dragon voice recognition software and may include unintentional dictation errors.    Faythe Ghee, PA-C 05/09/21 6270    Minna Antis, MD 05/09/21 1352

## 2021-10-29 IMAGING — CT CT HEAD W/O CM
3 series · 16 of 44 positions shown, 19 images · non-contrast
Comparison: Report from [id number], no images.

CLINICAL DATA: Chronic headache with nausea

EXAM:
CT HEAD WITHOUT CONTRAST
TECHNIQUE: Contiguous axial images were obtained from the base of the skull
through the vertex without intravenous contrast.

[Series 2: head wo · axial · 0.43mm/px · z∈[-146,-36]mm · 10 of 27 slices shown, 13 images]
[im 3/27  brain]
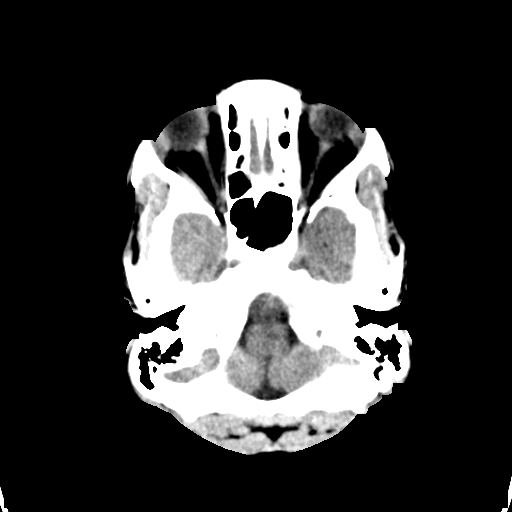
[im 3/27  bone]
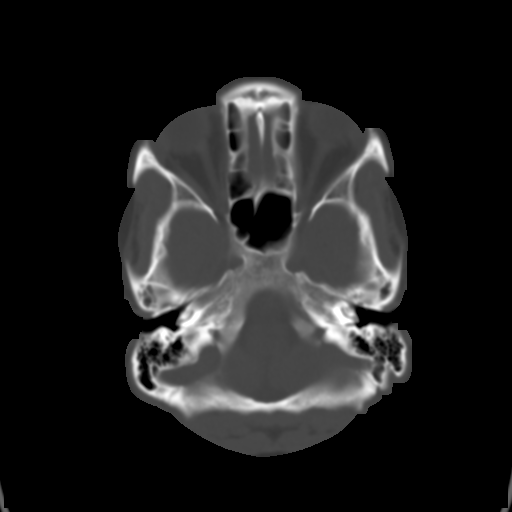
[im 5/27  brain]
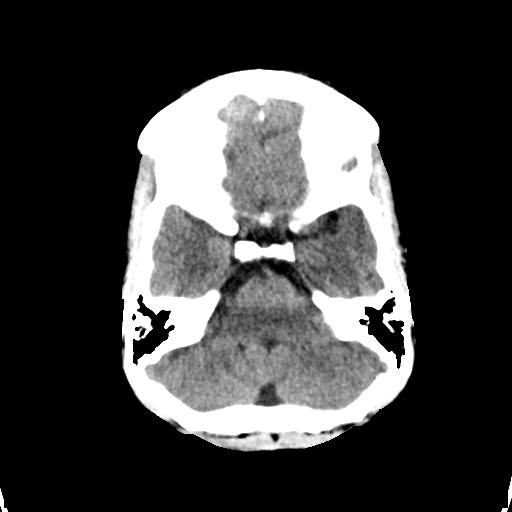
[im 8/27  brain]
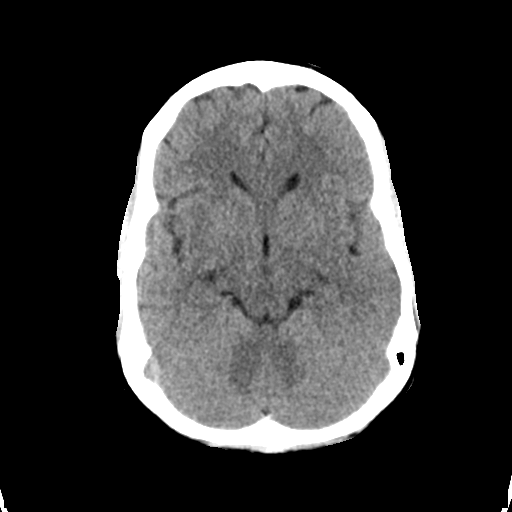
[im 10/27  brain]
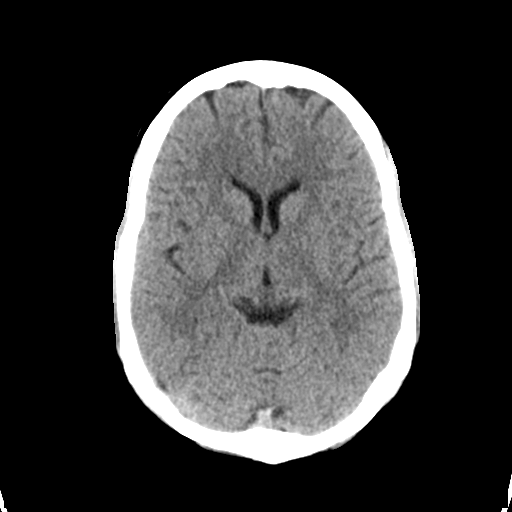
[im 13/27  brain]
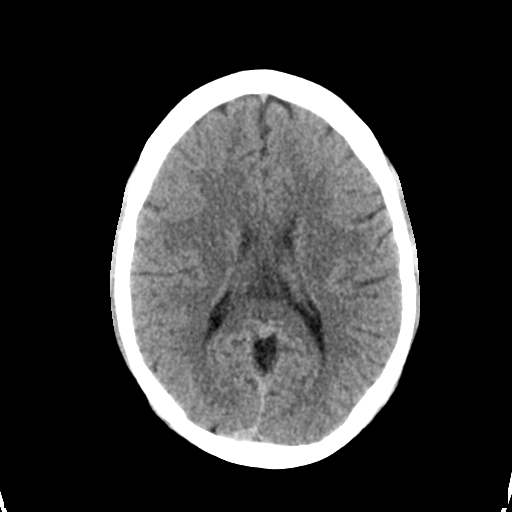
[im 13/27  bone]
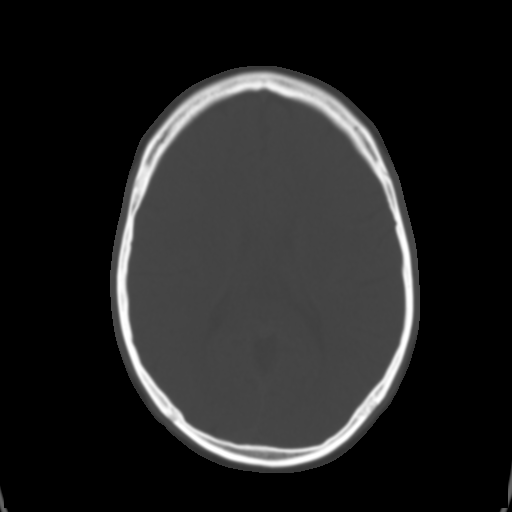
[im 15/27  brain]
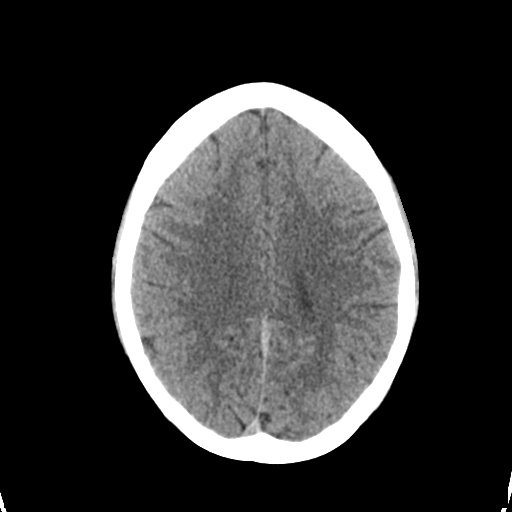
[im 18/27  brain]
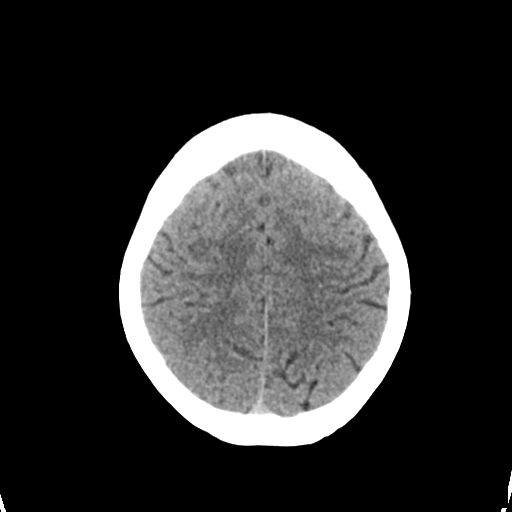
[im 20/27  brain]
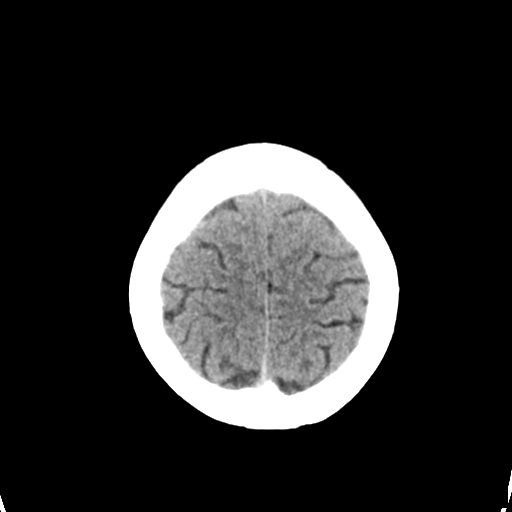
[im 23/27  brain]
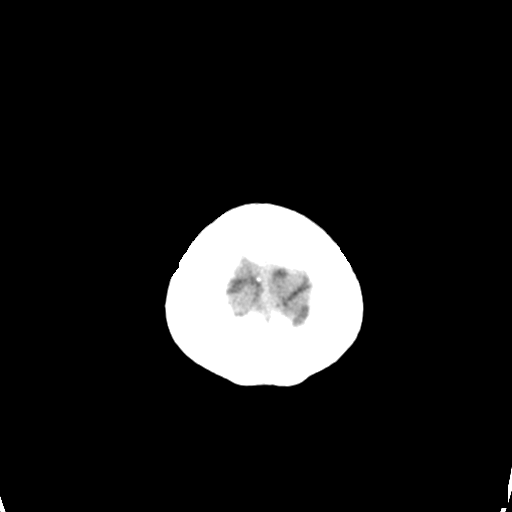
[im 23/27  bone]
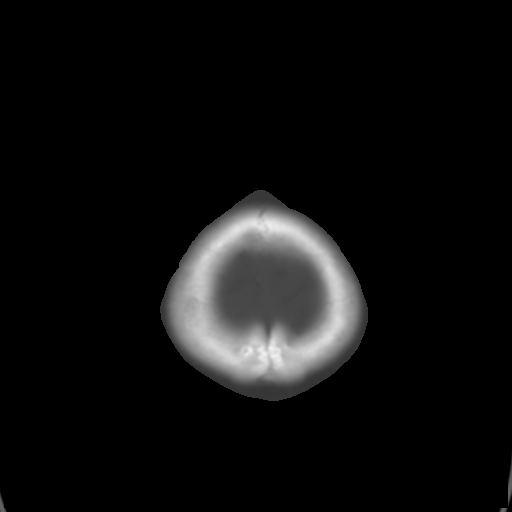
[im 25/27  brain]
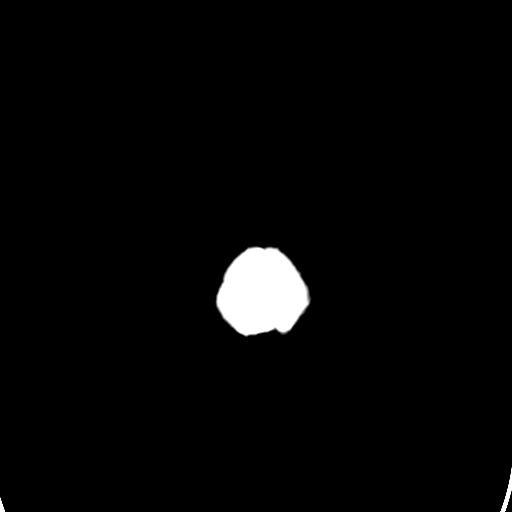

[Series 4: coronal soft tissue · coronal · 0.27mm/px · 3 of 62 slices shown]
[im 21/62  brain]
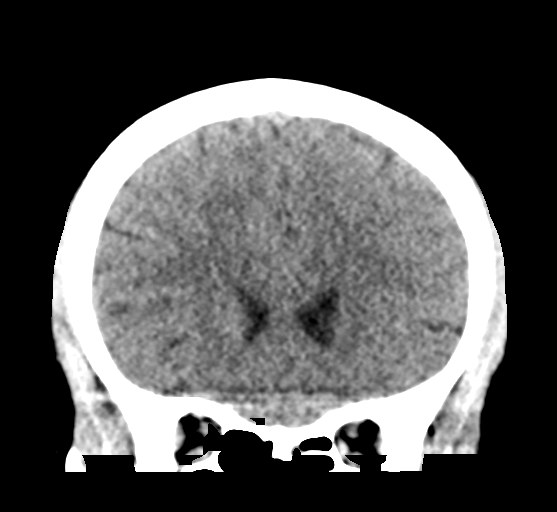
[im 28/62  brain]
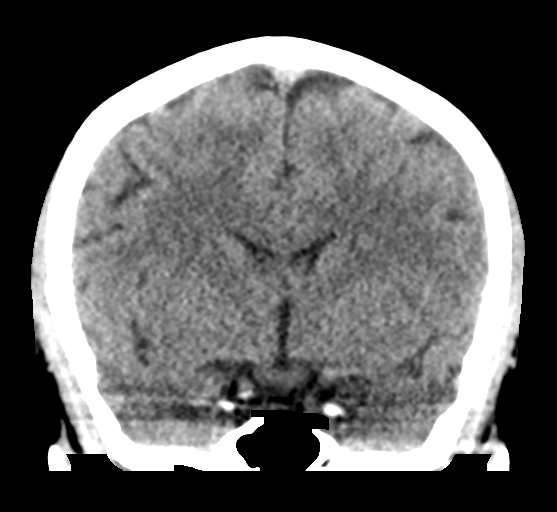
[im 34/62  brain]
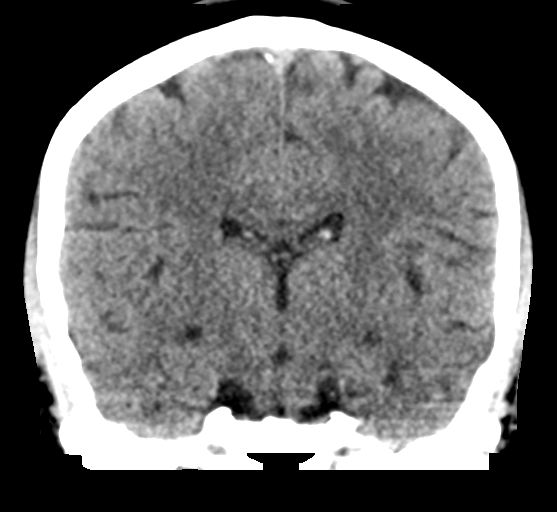

[Series 5: sagittal soft tissue · sagittal · 0.27mm/px · 3 of 50 slices shown]
[im 17/50  brain]
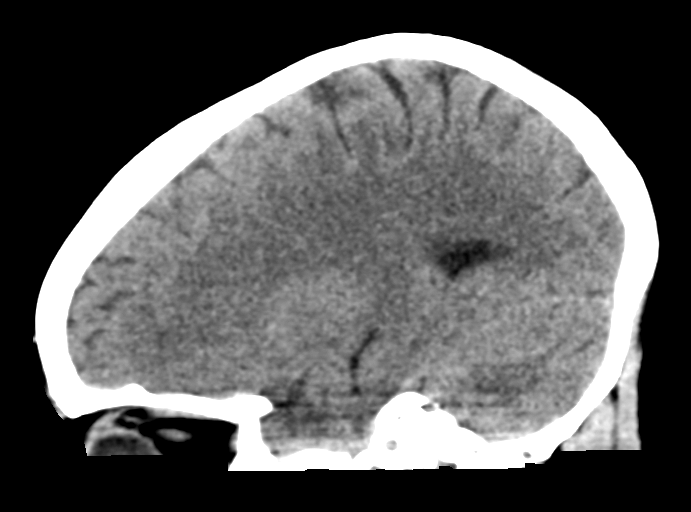
[im 25/50  brain]
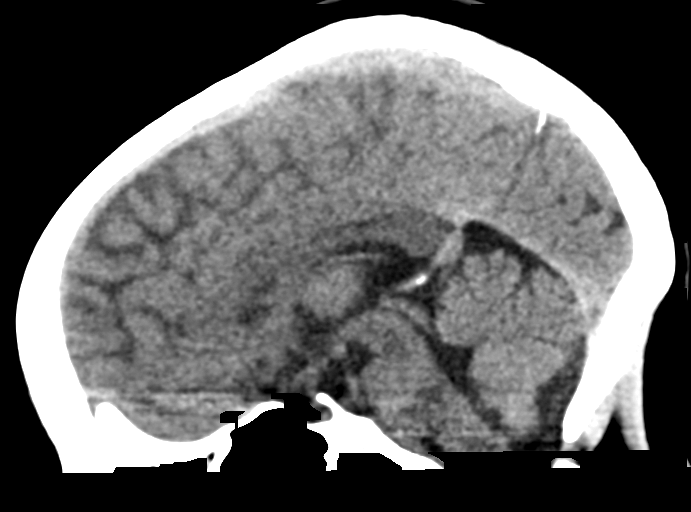
[im 33/50  brain]
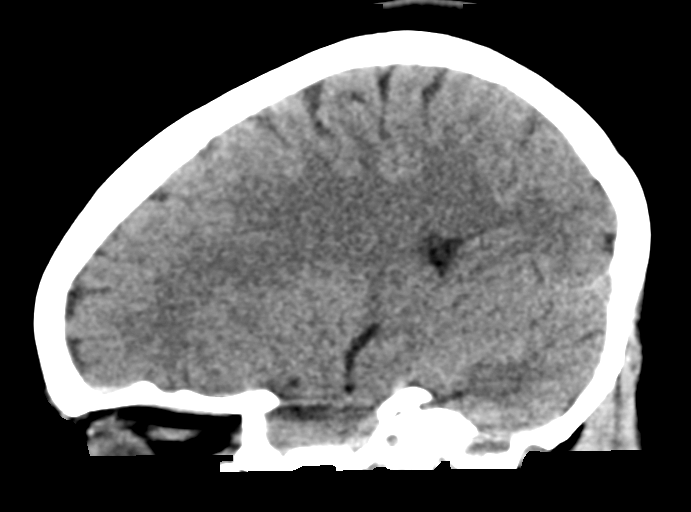

[16 of 44 positions shown; findings below may reference images not displayed]

FINDINGS: Brain: No evidence of acute infarction, hemorrhage, hydrocephalus,
extra-axial collection or mass lesion/mass effect.

Vascular: No hyperdense vessel or unexpected calcification.

Skull: Normal. Negative for fracture or focal lesion.

Sinuses/Orbits: Visualized paranasal sinuses and orbits are
remarkable only for scattered ethmoid opacification on the LEFT.

Other: None.
IMPRESSION: 1. No acute intracranial abnormality.
2. Mild mucosal thickening of ethmoid sinuses.

## 2022-02-22 ENCOUNTER — Other Ambulatory Visit: Payer: Self-pay

## 2022-02-22 ENCOUNTER — Encounter: Payer: Self-pay | Admitting: Intensive Care

## 2022-02-22 ENCOUNTER — Emergency Department
Admission: EM | Admit: 2022-02-22 | Discharge: 2022-02-22 | Disposition: A | Discharge status: Home / Self Care | Payer: Self-pay | Attending: Emergency Medicine | Admitting: Emergency Medicine

## 2022-02-22 ENCOUNTER — Emergency Department: Payer: Self-pay

## 2022-02-22 DIAGNOSIS — B349 Viral infection, unspecified: Secondary | ICD-10-CM | POA: Insufficient documentation

## 2022-02-22 DIAGNOSIS — Z20822 Contact with and (suspected) exposure to covid-19: Secondary | ICD-10-CM | POA: Insufficient documentation

## 2022-02-22 LAB — RESP PANEL BY RT-PCR (FLU A&B, COVID) ARPGX2
Influenza A by PCR: NEGATIVE
Influenza B by PCR: NEGATIVE
SARS Coronavirus 2 by RT PCR: NEGATIVE

## 2022-02-22 MED ORDER — FLUTICASONE PROPIONATE 50 MCG/ACT NA SUSP: 1.0000 | Freq: Two times a day (BID) | NASAL | 0 refills | Status: DC

## 2022-02-22 MED ORDER — BENZONATATE 100 MG PO CAPS: 100.0000 mg | ORAL_CAPSULE | Freq: Three times a day (TID) | ORAL | 0 refills | Status: DC | PRN

## 2022-02-22 NOTE — ED Provider Notes (Signed)
[facility] Provider Note  Patient Contact: 7:54 PM (approximate)   History   Cough and Headache   HPI  Margaret Crawford is a 26 y.o. female who presents the emergency department complaining of congestion, headache, cough.  Patient states that she has had symptoms x3 days.  No reported fever.  No difficulty breathing.  No sore throat.  No recent sick contacts.     Physical Exam   Triage Vital Signs: ED Triage Vitals [02/22/22 1656]  Enc Vitals Group     BP 125/82     Pulse Rate (!) 105     Resp 16     Temp 98.4 F (36.9 C)     Temp Source Oral     SpO2 94 %     Weight 115 lb (52.2 kg)     Height 5\' 5"  (1.651 m)     Head Circumference      Peak Flow      Pain Score 8     Pain Loc      Pain Edu?      Excl. in GC?     Most recent vital signs: Vitals:   02/22/22 1656  BP: 125/82  Pulse: (!) 105  Resp: 16  Temp: 98.4 F (36.9 C)  SpO2: 94%     General: Alert and in no acute distress. ENT:      Ears:       Nose: No congestion/rhinnorhea.      Mouth/Throat: Mucous membranes are moist. Neck: No stridor. No cervical spine tenderness to palpation. Hematological/Lymphatic/Immunilogical: No cervical lymphadenopathy. Cardiovascular:  Good peripheral perfusion Respiratory: Normal respiratory effort without tachypnea or retractions. Lungs CTAB. Good air entry to the bases with no decreased or absent breath sounds. Musculoskeletal: Full range of motion to all extremities.  Neurologic:  No gross focal neurologic deficits are appreciated.  Skin:   No rash noted Other:   ED Results / Procedures / Treatments   Labs (all labs ordered are listed, but only abnormal results are displayed) Labs Reviewed  RESP PANEL BY RT-PCR (FLU A&B, COVID) ARPGX2     EKG     RADIOLOGY  I personally viewed, evaluated, and interpreted these images as part of my medical decision making, as well as reviewing the written report by the radiologist.  ED  Provider Interpretation: No consolidation concerning for pneumonia.  DG Chest 2 View  Result Date: 02/22/2022 CLINICAL DATA:  Cough, headaches EXAM: CHEST - 2 VIEW COMPARISON:  04/26/2019 FINDINGS: The heart size and mediastinal contours are within normal limits. Both lungs are clear. The visualized skeletal structures are unremarkable. IMPRESSION: No active cardiopulmonary disease. Electronically Signed   By: 04/28/2019 M.D.   On: 02/22/2022 17:19    PROCEDURES:  Critical Care performed: No  Procedures   MEDICATIONS ORDERED IN ED: Medications - No data to display   IMPRESSION / MDM / ASSESSMENT AND PLAN / ED COURSE  I reviewed the triage vital signs and the nursing notes.                              Differential diagnosis includes, but is not limited to, viral illness, COVID, flu, bronchitis, pneumonia  Patient's presentation is most consistent with acute presentation with potential threat to life or bodily function.   Patient's diagnosis is consistent with viral illness.  Patient presents to the ED with viral symptoms.  Negative COVID and flu.  Chest x-ray reveals no pneumonia or bronchitis.  Flonase and cough medication prescribed for the patient at this time.  Follow-up primary care as needed..  Patient is given ED precautions to return to the ED for any worsening or new symptoms.        FINAL CLINICAL IMPRESSION(S) / ED DIAGNOSES   Final diagnoses:  Viral illness     Rx / DC Orders   ED Discharge Orders          Ordered    fluticasone (FLONASE) 50 MCG/ACT nasal spray  2 times daily        02/22/22 2006    benzonatate (TESSALON PERLES) 100 MG capsule  3 times daily PRN        02/22/22 2006             Note:  This document was prepared using Dragon voice recognition software and may include unintentional dictation errors.   Lanette Hampshire 02/22/22 2007    Pilar Jarvis, MD 02/23/22 1106

## 2022-02-22 NOTE — ED Provider Triage Note (Signed)
Emergency Medicine Provider Triage Evaluation Note  Margaret Crawford, a 26 y.o. female  was evaluated in triage.  Pt complains of cough and sore throat.  Patient presents with headache, intermittent cough and congestion for the last few days.  She denies any fevers, chills, or sweats.  Review of Systems  Positive: Sinus congestion, cough Negative: FCS  Physical Exam  BP 125/82 (BP Location: Left Arm)   Pulse (!) 105   Temp 98.4 F (36.9 C) (Oral)   Resp 16   Ht 5\' 5"  (1.651 m)   Wt 52.2 kg   LMP 02/16/2022 (Exact Date)   SpO2 94%   BMI 19.14 kg/m  Gen:   Awake, no distress NAD Resp:  Normal effort CTA MSK:   Moves extremities without difficulty  CVS:  RRR  Medical Decision Making  Medically screening exam initiated at 5:09 PM.  Appropriate orders placed.  02/18/2022 Margaret Crawford was informed that the remainder of the evaluation will be completed by another provider, this initial triage assessment does not replace that evaluation, and the importance of remaining in the ED until their evaluation is complete.  Patient to the ED for evaluation of a few days of cough, congestion, and intermittent headaches.  She denies any associated fevers.   Margaret Stalker, PA-C 02/22/22 1710

## 2022-02-22 NOTE — ED Notes (Signed)
Patient declined discharge vital signs. 

## 2022-02-22 NOTE — ED Triage Notes (Signed)
C/o headache, cough, and congestion for a few days

## 2022-06-16 ENCOUNTER — Emergency Department: Payer: No Typology Code available for payment source

## 2022-06-16 ENCOUNTER — Encounter: Payer: Self-pay | Admitting: Emergency Medicine

## 2022-06-16 ENCOUNTER — Emergency Department
Admission: EM | Admit: 2022-06-16 | Discharge: 2022-06-16 | Disposition: A | Discharge status: Home / Self Care | Payer: No Typology Code available for payment source | Attending: Student in an Organized Health Care Education/Training Program | Admitting: Student in an Organized Health Care Education/Training Program

## 2022-06-16 ENCOUNTER — Other Ambulatory Visit: Payer: Self-pay

## 2022-06-16 DIAGNOSIS — M25511 Pain in right shoulder: Secondary | ICD-10-CM | POA: Diagnosis not present

## 2022-06-16 DIAGNOSIS — M25512 Pain in left shoulder: Secondary | ICD-10-CM | POA: Diagnosis not present

## 2022-06-16 DIAGNOSIS — M545 Low back pain, unspecified: Secondary | ICD-10-CM | POA: Diagnosis present

## 2022-06-16 DIAGNOSIS — G8911 Acute pain due to trauma: Secondary | ICD-10-CM | POA: Insufficient documentation

## 2022-06-16 DIAGNOSIS — Y92410 Unspecified street and highway as the place of occurrence of the external cause: Secondary | ICD-10-CM | POA: Diagnosis not present

## 2022-06-16 MED ORDER — CYCLOBENZAPRINE HCL 5 MG PO TABS: 5.0000 mg | ORAL_TABLET | Freq: Three times a day (TID) | ORAL | 0 refills | Status: DC | PRN

## 2022-06-16 MED ORDER — CYCLOBENZAPRINE HCL 10 MG PO TABS
5.0000 mg | ORAL_TABLET | Freq: Once | ORAL | Status: AC
  Administered 2022-06-16: 5 mg via ORAL
  Filled 2022-06-16: qty 1
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
## 2022-07-02 ENCOUNTER — Emergency Department: Payer: Self-pay

## 2022-07-02 ENCOUNTER — Other Ambulatory Visit: Payer: Self-pay

## 2022-07-02 ENCOUNTER — Emergency Department
Admission: EM | Admit: 2022-07-02 | Discharge: 2022-07-02 | Disposition: A | Discharge status: Home / Self Care | Payer: Self-pay | Attending: Emergency Medicine | Admitting: Emergency Medicine

## 2022-07-02 DIAGNOSIS — S46911A Strain of unspecified muscle, fascia and tendon at shoulder and upper arm level, right arm, initial encounter: Secondary | ICD-10-CM | POA: Insufficient documentation

## 2022-07-02 DIAGNOSIS — G44209 Tension-type headache, unspecified, not intractable: Secondary | ICD-10-CM | POA: Insufficient documentation

## 2022-07-02 DIAGNOSIS — Y990 Civilian activity done for income or pay: Secondary | ICD-10-CM | POA: Insufficient documentation

## 2022-07-02 DIAGNOSIS — X500XXA Overexertion from strenuous movement or load, initial encounter: Secondary | ICD-10-CM | POA: Insufficient documentation

## 2022-07-02 DIAGNOSIS — T148XXA Other injury of unspecified body region, initial encounter: Secondary | ICD-10-CM

## 2022-07-02 DIAGNOSIS — S46912A Strain of unspecified muscle, fascia and tendon at shoulder and upper arm level, left arm, initial encounter: Secondary | ICD-10-CM | POA: Insufficient documentation

## 2022-07-02 MED ORDER — BACLOFEN 10 MG PO TABS: 10.0000 mg | ORAL_TABLET | Freq: Three times a day (TID) | ORAL | 0 refills | Status: AC

## 2022-07-02 MED ORDER — PREDNISONE 10 MG (21) PO TBPK: ORAL_TABLET | ORAL | 0 refills | Status: DC
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
## 2022-07-02 NOTE — ED Notes (Signed)
First Nurse Note: Pt to ED via POV for back pain. Pt is in NAD.
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
## 2022-11-06 ENCOUNTER — Other Ambulatory Visit: Payer: Self-pay

## 2022-11-06 ENCOUNTER — Emergency Department
Admission: EM | Admit: 2022-11-06 | Discharge: 2022-11-06 | Disposition: A | Discharge status: Home / Self Care | Payer: Medicaid Other | Attending: Emergency Medicine | Admitting: Emergency Medicine

## 2022-11-06 ENCOUNTER — Encounter: Payer: Self-pay | Admitting: Medical Oncology

## 2022-11-06 DIAGNOSIS — U071 COVID-19: Secondary | ICD-10-CM | POA: Diagnosis not present

## 2022-11-06 DIAGNOSIS — R059 Cough, unspecified: Secondary | ICD-10-CM | POA: Diagnosis present

## 2022-11-06 LAB — SARS CORONAVIRUS 2 BY RT PCR: SARS Coronavirus 2 by RT PCR: POSITIVE — AB

## 2022-11-06 MED ORDER — BENZONATATE 200 MG PO CAPS: 200.0000 mg | ORAL_CAPSULE | Freq: Three times a day (TID) | ORAL | 0 refills | Status: DC | PRN

## 2022-11-06 NOTE — ED Provider Notes (Signed)
[facility] Provider Note    Event Date/Time   First MD Initiated Contact with Patient 11/06/22 [phone]     (approximate)   History   Cough   HPI  Margaret Crawford is a 27 y.o. female   presents to the ED with complaint of cough symptoms that began last evening but denies any fever or other symptoms.  Patient states that she was around 3 people who are also been sick and diagnosed with COVID.      Physical Exam   Triage Vital Signs: ED Triage Vitals [11/06/22 0834]  Enc Vitals Group     BP 114/83     Pulse Rate 71     Resp 16     Temp 98.3 F (36.8 C)     Temp Source Oral     SpO2 100 %     Weight 114 lb 10.2 oz (52 kg)     Height 5\' 5"  (1.651 m)     Head Circumference      Peak Flow      Pain Score 0     Pain Loc      Pain Edu?      Excl. in GC?     Most recent vital signs: Vitals:   11/06/22 0834  BP: 114/83  Pulse: 71  Resp: 16  Temp: 98.3 F (36.8 C)  SpO2: 100%     General: Awake, no distress.  CV:  Good peripheral perfusion.  Resp:  Normal effort.  Lungs are clear bilaterally. Abd:  No distention.  Other:     ED Results / Procedures / Treatments   Labs (all labs ordered are listed, but only abnormal results are displayed) Labs Reviewed  SARS CORONAVIRUS 2 BY RT PCR - Abnormal; Notable for the following components:      Result Value   SARS Coronavirus 2 by RT PCR POSITIVE (*)    All other components within normal limits    PROCEDURES:  Critical Care performed:   Procedures   MEDICATIONS ORDERED IN ED: Medications - No data to display   IMPRESSION / MDM / ASSESSMENT AND PLAN / ED COURSE  I reviewed the triage vital signs and the nursing notes.   Differential diagnosis includes, but is not limited to, COVID, influenza, viral upper respiratory infection.  27 year old female presents to the ED with onset of symptoms last evening and exposure to COVID.  Patient was made aware that her COVID test was  positive and that she needs to quarantine for 5 days as she has already had the COVID-vaccine in the past.  Patient is afebrile in the ED and vital signs are stable.  Patient agrees to comply and a note was written for her to remain out of work.      Patient's presentation is most consistent with acute complicated illness / injury requiring diagnostic workup.  FINAL CLINICAL IMPRESSION(S) / ED DIAGNOSES   Final diagnoses:  COVID     Rx / DC Orders   ED Discharge Orders          Ordered    benzonatate (TESSALON) 200 MG capsule  3 times daily PRN        11/06/22 4540             Note:  This document was prepared using Dragon voice recognition software and may include unintentional dictation errors.   [name], PA-C 11/06/22 1309    Concha Se, MD 11/07/22 [phone]

## 2022-11-06 NOTE — ED Triage Notes (Signed)
Pt reports cough that began last night, has been around people with covid, denies fever or other symptoms.

## 2022-11-06 NOTE — Discharge Instructions (Signed)
Call your primary care provider if any continued problems or concerns.  Drink fluids to stay hydrated.  Tylenol or ibuprofen as needed for fever, body aches or headache.  A prescription for Tessalon Perles was sent to the pharmacy to take as needed for cough.  Tried to stay up and moving to help prevent pneumonia.  You should plan on isolating for the next 5 days as you had the COVID-vaccine.

## 2023-01-15 ENCOUNTER — Encounter: Payer: Self-pay | Admitting: Emergency Medicine

## 2023-01-15 ENCOUNTER — Other Ambulatory Visit: Payer: Self-pay

## 2023-01-15 DIAGNOSIS — R102 Pelvic and perineal pain: Secondary | ICD-10-CM | POA: Diagnosis not present

## 2023-01-15 NOTE — ED Triage Notes (Signed)
Pt presents ambulatory to triage via POV with complaints of period cramps that has resolved. Pt notes starting her cycle 4-5 days ago and was having some pain earlier today that was relived with some OTC NSAIDs but she had to leave work and her supervisor is requesting she provide an excuse note. A&Ox4 at this time. Denies CP or SOB.

## 2023-01-16 ENCOUNTER — Emergency Department
Admission: EM | Admit: 2023-01-16 | Discharge: 2023-01-16 | Disposition: A | Discharge status: Home / Self Care | Payer: Medicaid Other | Attending: Student in an Organized Health Care Education/Training Program | Admitting: Student in an Organized Health Care Education/Training Program

## 2023-01-16 DIAGNOSIS — R102 Pelvic and perineal pain: Secondary | ICD-10-CM

## 2023-01-16 LAB — POC URINE PREG, ED: Preg Test, Ur: NEGATIVE

## 2023-01-16 MED ORDER — ONDANSETRON 4 MG PO TBDP: 4.0000 mg | ORAL_TABLET | Freq: Three times a day (TID) | ORAL | 0 refills | Status: DC | PRN

## 2023-01-16 NOTE — ED Provider Notes (Signed)
[facility] Provider Note    Event Date/Time   First MD Initiated Contact with Patient 01/16/23 [phone]     (approximate)   History   Letter for School/Work and Dysmenorrhea   HPI  Margaret Crawford is a 27 y.o. female  who presents to the ER requesting work note because she was having menstrual cramps earlier in the day associated with some nausea and could not go to work.  Her symptoms have resolved.  She is requesting something for nausea as she frequently gets nausea with her menstrual cycles.  Denies any abdominal pain no dysuria no diarrhea.  No chest pain no other complaints.      Physical Exam   Triage Vital Signs: ED Triage Vitals  Encounter Vitals Group     BP 01/15/23 2121 126/83     Systolic BP Percentile --      Diastolic BP Percentile --      Pulse Rate 01/15/23 2121 86     Resp 01/15/23 2121 18     Temp 01/15/23 2121 98.2 F (36.8 C)     Temp Source 01/15/23 2121 Oral     SpO2 01/15/23 2121 100 %     Weight 01/15/23 2119 112 lb (50.8 kg)     Height 01/15/23 2119 5\' 5"  (1.651 m)     Head Circumference --      Peak Flow --      Pain Score 01/15/23 2119 0     Pain Loc --      Pain Education --      Exclude from Growth Chart --     Most recent vital signs: Vitals:   01/15/23 2121  BP: 126/83  Pulse: 86  Resp: 18  Temp: 98.2 F (36.8 C)  SpO2: 100%     Constitutional: Alert  Eyes: Conjunctivae are normal.  Head: Atraumatic. Nose: No congestion/rhinnorhea. Mouth/Throat: Mucous membranes are moist.   Neck: Painless ROM.  Gastrointestinal: Soft and nontender in all four quadrants Musculoskeletal:  no deformity Neurologic:  MAE spontaneously. No gross focal neurologic deficits are appreciated.      ED Results / Procedures / Treatments   Labs (all labs ordered are listed, but only abnormal results are displayed) Labs Reviewed  POC URINE PREG, ED     PROCEDURES:  Critical Care performed:    Procedures   MEDICATIONS ORDERED IN ED: Medications - No data to display   IMPRESSION / MDM / ASSESSMENT AND PLAN / ED COURSE  I reviewed the triage vital signs and the nursing notes.                              Differential diagnosis includes, but is not limited to,  menstrual cramps, dehydration, enteritis   Patient presented to the ER requesting work note.  Her exam is reassuring.  Have considered pathology such as appendicitis ovarian torsion, PID, infectious process but exceedingly unlikely given her well appearance.  Lab work currently unavailable 2/2 international system downtime. Unknown when will be available again.  She appears ok for outpatient follow up.  Discussed return precautions.       FINAL CLINICAL IMPRESSION(S) / ED DIAGNOSES   Final diagnoses:  Pelvic cramping     Rx / DC Orders   ED Discharge Orders          Ordered    ondansetron (ZOFRAN-ODT) 4 MG disintegrating tablet  Every 8 hours PRN  01/16/23 0046             Note:  This document was prepared using Dragon voice recognition software and may include unintentional dictation errors.    Willy Eddy, MD 01/16/23 [phone]

## 2023-01-16 NOTE — Discharge Instructions (Signed)
Follow up with pcp.  Return for any additional questions or concerns.

## 2023-03-28 ENCOUNTER — Emergency Department
Admission: EM | Admit: 2023-03-28 | Discharge: 2023-03-28 | Disposition: A | Discharge status: Home / Self Care | Payer: Medicaid Other | Attending: Emergency Medicine | Admitting: Emergency Medicine

## 2023-03-28 ENCOUNTER — Other Ambulatory Visit: Payer: Self-pay

## 2023-03-28 DIAGNOSIS — J069 Acute upper respiratory infection, unspecified: Secondary | ICD-10-CM | POA: Insufficient documentation

## 2023-03-28 DIAGNOSIS — Z20822 Contact with and (suspected) exposure to covid-19: Secondary | ICD-10-CM | POA: Insufficient documentation

## 2023-03-28 DIAGNOSIS — R059 Cough, unspecified: Secondary | ICD-10-CM | POA: Diagnosis present

## 2023-03-28 LAB — RESP PANEL BY RT-PCR (RSV, FLU A&B, COVID)  RVPGX2
Influenza A by PCR: NEGATIVE
Influenza B by PCR: NEGATIVE
Resp Syncytial Virus by PCR: NEGATIVE
SARS Coronavirus 2 by RT PCR: NEGATIVE

## 2023-03-28 MED ORDER — IBUPROFEN 600 MG PO TABS
600.0000 mg | ORAL_TABLET | Freq: Once | ORAL | Status: AC
  Administered 2023-03-28: 600 mg via ORAL
  Filled 2023-03-28: qty 1

## 2023-03-28 MED ORDER — ACETAMINOPHEN 500 MG PO TABS
1000.0000 mg | ORAL_TABLET | Freq: Once | ORAL | Status: AC
  Administered 2023-03-28: 1000 mg via ORAL
  Filled 2023-03-28: qty 2

## 2023-03-28 NOTE — Discharge Instructions (Signed)
You were seen in the emergency department today for your cough, congestion, and headache.  I do believe you have a viral upper respiratory infection.  You were negative for COVID, and flu today.  I recommend ibuprofen and Tylenol to take as needed help with symptoms.  You can also pick up over-the-counter nasal decongestion such as Mucinex to help relieve sinus congestion.  Please follow-up with your regular doctor as needed.

## 2023-03-28 NOTE — ED Triage Notes (Signed)
Pt c/o fever, unproductive cough, nasal congestion, headache since last night. Pt denies N/V/D, SHOB, dizziness, CP, and sore throat and body aches. Pt AOX4, cough nasal drainage and congestion noted. Respirations even and unlabored, lung sounds clear bilaterally.

## 2023-03-28 NOTE — ED Provider Notes (Signed)
[facility] Provider Note    Event Date/Time   First MD Initiated Contact with Patient 03/28/23 1753     (approximate)   History   URI   HPI Margaret Crawford is a 27 y.o. female presenting today for nasal congestion cough, and headache.  Symptoms started 24 hours ago.  She has not taken any medication for them.  She denies nausea, vomiting, shortness of breath, diarrhea, chest pain, fevers, sore throat.  No known sick contacts.     Physical Exam   Triage Vital Signs: ED Triage Vitals  Encounter Vitals Group     BP 03/28/23 1707 (!) 166/83     Systolic BP Percentile --      Diastolic BP Percentile --      Pulse Rate 03/28/23 1707 83     Resp 03/28/23 1707 16     Temp 03/28/23 1707 98.2 F (36.8 C)     Temp Source 03/28/23 1707 Oral     SpO2 03/28/23 1707 100 %     Weight --      Height --      Head Circumference --      Peak Flow --      Pain Score 03/28/23 1708 10     Pain Loc --      Pain Education --      Exclude from Growth Chart --     Most recent vital signs: Vitals:   03/28/23 1707  BP: (!) 166/83  Pulse: 83  Resp: 16  Temp: 98.2 F (36.8 C)  SpO2: 100%   Physical Exam: I have reviewed the vital signs and nursing notes. General: Awake, alert, no acute distress.  Nontoxic appearing. Head:  Atraumatic, normocephalic.   ENT:  EOM intact, PERRL. Oral mucosa is pink and moist with no lesions. Neck: Neck is supple with full range of motion, No meningeal signs. Cardiovascular:  RRR, No murmurs. Peripheral pulses palpable and equal bilaterally. Respiratory:  Symmetrical chest wall expansion.  No rhonchi, rales, or wheezes.  Good air movement throughout.  No use of accessory muscles.   Musculoskeletal:  No cyanosis or edema. Moving extremities with full ROM Abdomen:  Soft, nontender, nondistended. Neuro:  GCS 15, moving all four extremities, interacting appropriately. Speech clear. Psych:  Calm, appropriate.   Skin:  Warm, dry,  no rash.    ED Results / Procedures / Treatments   Labs (all labs ordered are listed, but only abnormal results are displayed) Labs Reviewed  RESP PANEL BY RT-PCR (RSV, FLU A&B, COVID)  [id number]     EKG    RADIOLOGY    PROCEDURES:  Critical Care performed: No  Procedures   MEDICATIONS ORDERED IN ED: Medications  ibuprofen (ADVIL) tablet 600 mg (600 mg Oral Given 03/28/23 1832)  acetaminophen (TYLENOL) tablet 1,000 mg (1,000 mg Oral Given 03/28/23 1831)     IMPRESSION / MDM / ASSESSMENT AND PLAN / ED COURSE  I reviewed the triage vital signs and the nursing notes.                              Differential diagnosis includes, but is not limited to, viral URI with cough, sinus infection.  Patient's presentation is most consistent with acute, uncomplicated illness.  Patient is a 27 year old female presenting today for cough, congestion, and headache.  Symptoms most consistent with a viral URI.  Vital signs stable and exam otherwise unremarkable.  Patient  was negative for COVID, flu, and RSV.  She was given ibuprofen and Tylenol here in the emergency department.  Patient otherwise stable for discharge at this time with outpatient management of her viral URI.      FINAL CLINICAL IMPRESSION(S) / ED DIAGNOSES   Final diagnoses:  Viral URI with cough     Rx / DC Orders   ED Discharge Orders     None        Note:  This document was prepared using Dragon voice recognition software and may include unintentional dictation errors.   [name], MD 03/28/23 [phone]

## 2023-04-08 ENCOUNTER — Other Ambulatory Visit: Payer: Self-pay

## 2023-04-08 ENCOUNTER — Emergency Department
Admission: EM | Admit: 2023-04-08 | Discharge: 2023-04-08 | Disposition: A | Discharge status: Home / Self Care | Payer: Medicaid Other | Attending: Emergency Medicine | Admitting: Emergency Medicine

## 2023-04-08 DIAGNOSIS — R197 Diarrhea, unspecified: Secondary | ICD-10-CM | POA: Diagnosis present

## 2023-04-08 DIAGNOSIS — E876 Hypokalemia: Secondary | ICD-10-CM | POA: Diagnosis not present

## 2023-04-08 DIAGNOSIS — K529 Noninfective gastroenteritis and colitis, unspecified: Secondary | ICD-10-CM | POA: Insufficient documentation

## 2023-04-08 DIAGNOSIS — Z20822 Contact with and (suspected) exposure to covid-19: Secondary | ICD-10-CM | POA: Insufficient documentation

## 2023-04-08 LAB — BASIC METABOLIC PANEL
Anion gap: 12 (ref 5–15)
BUN: 13 mg/dL (ref 6–20)
CO2: 22 mmol/L (ref 22–32)
Calcium: 8.9 mg/dL (ref 8.9–10.3)
Chloride: 99 mmol/L (ref 98–111)
Creatinine, Ser: 1.03 mg/dL — ABNORMAL HIGH (ref 0.44–1.00)
GFR, Estimated: >60 mL/min (ref >60)
Glucose, Bld: 151 mg/dL — ABNORMAL HIGH (ref 70–99)
Potassium: 2.6 mmol/L — CL (ref 3.5–5.1)
Sodium: 133 mmol/L — ABNORMAL LOW (ref 135–145)

## 2023-04-08 LAB — CBC
HCT: 36.3 % (ref 36.0–46.0)
Hemoglobin: 12.8 g/dL (ref 12.0–15.0)
MCH: 30.4 pg (ref 26.0–34.0)
MCHC: 35.3 g/dL (ref 30.0–36.0)
MCV: 86.2 fL (ref 80.0–100.0)
Platelets: 293 10*3/uL (ref 150–400)
RBC: 4.21 MIL/uL (ref 3.87–5.11)
RDW: 11.7 % (ref 11.5–15.5)
WBC: 14.8 10*3/uL — ABNORMAL HIGH (ref 4.0–10.5)
nRBC: 0 % (ref 0.0–0.2)

## 2023-04-08 LAB — SARS CORONAVIRUS 2 BY RT PCR: SARS Coronavirus 2 by RT PCR: NEGATIVE

## 2023-04-08 LAB — POTASSIUM: Potassium: 3.5 mmol/L (ref 3.5–5.1)

## 2023-04-08 MED ORDER — POTASSIUM CHLORIDE CRYS ER 20 MEQ PO TBCR
40.0000 meq | EXTENDED_RELEASE_TABLET | Freq: Once | ORAL | Status: AC
  Administered 2023-04-08: 40 meq via ORAL
  Filled 2023-04-08: qty 2

## 2023-04-08 MED ORDER — POTASSIUM CHLORIDE CRYS ER 20 MEQ PO TBCR: 20.0000 meq | EXTENDED_RELEASE_TABLET | Freq: Two times a day (BID) | ORAL | 0 refills | Status: AC

## 2023-04-08 MED ORDER — ONDANSETRON HCL 4 MG/2ML IJ SOLN
4.0000 mg | Freq: Once | INTRAMUSCULAR | Status: AC
  Administered 2023-04-08: 4 mg via INTRAVENOUS
  Filled 2023-04-08: qty 2

## 2023-04-08 MED ORDER — ONDANSETRON 4 MG PO TBDP: 4.0000 mg | ORAL_TABLET | Freq: Three times a day (TID) | ORAL | 0 refills | Status: AC | PRN

## 2023-04-08 MED ORDER — SODIUM CHLORIDE 0.9 % IV BOLUS
1000.0000 mL | Freq: Once | INTRAVENOUS | Status: AC
  Administered 2023-04-08: 1000 mL via INTRAVENOUS

## 2023-04-08 NOTE — Discharge Instructions (Addendum)
Call make an appoint with your primary care provider at [facility] clinic for recheck of your potassium level which was low today.  A prescription for Zofran was sent to the pharmacy for you to take as needed for nausea along with a prescription for potassium.  Clear liquids for the next 24 hours which includes water, Gatorade, Jell-O, chicken broth, beef and vegetable broth and popsicles.  This should help with the diarrhea.  Return to the emergency department if any severe worsening of your symptoms.

## 2023-04-08 NOTE — ED Triage Notes (Signed)
Pt presents to ED with c/o of N/V/D. Pt states this started yesterday. NAD noted.

## 2023-04-08 NOTE — ED Provider Notes (Signed)
[facility] Provider Note    Event Date/Time   First MD Initiated Contact with Patient 04/08/23 1304     (approximate)   History   Diarrhea   HPI  Margaret Crawford is a 27 y.o. female   presents to the ED with complaint of nausea, vomiting and diarrhea.  Patient states that she vomited once or twice yesterday and has had diarrhea twice today.  Patient denies any known fever and no other family member sick at this time.  She reports that she feels very weak after the vomiting and diarrhea.  History of alcohol frequency on the weekends.  Patient denies use of drugs.       Physical Exam   Triage Vital Signs: ED Triage Vitals [04/08/23 1148]  Encounter Vitals Group     BP 125/79     Systolic BP Percentile      Diastolic BP Percentile      Pulse Rate (!) 116     Resp 17     Temp 98.7 F (37.1 C)     Temp Source Oral     SpO2 97 %     Weight      Height      Head Circumference      Peak Flow      Pain Score      Pain Loc      Pain Education      Exclude from Growth Chart     Most recent vital signs: Vitals:   04/08/23 1453 04/08/23 1505  BP:  110/79  Pulse:    Resp: 15   Temp:    SpO2:       General: Awake, no distress.  Alert, talkative.  Nontoxic in appearance. CV:  Good peripheral perfusion.  Resp:  Normal effort.  Lungs are clear bilaterally. Abd:  No distention.  Soft, flat, nontender.  Bowel sounds hyperactive x 4 quadrants. Other:     ED Results / Procedures / Treatments   Labs (all labs ordered are listed, but only abnormal results are displayed) Labs Reviewed  CBC - Abnormal; Notable for the following components:      Result Value   WBC 14.8 (*)    All other components within normal limits  BASIC METABOLIC PANEL - Abnormal; Notable for the following components:   Sodium 133 (*)    Potassium 2.6 (*)    Glucose, Bld 151 (*)    Creatinine, Ser 1.03 (*)    All other components within normal limits  SARS  CORONAVIRUS 2 BY RT PCR  POTASSIUM  POC URINE PREG, ED     PROCEDURES:  Critical Care performed:   Procedures   MEDICATIONS ORDERED IN ED: Medications  sodium chloride 0.9 % bolus 1,000 mL (0 mLs Intravenous Stopped 04/08/23 1546)  potassium chloride SA (KLOR-CON M) CR tablet 40 mEq (40 mEq Oral Given 04/08/23 1334)  ondansetron (ZOFRAN) injection 4 mg (4 mg Intravenous Given 04/08/23 1355)     IMPRESSION / MDM / ASSESSMENT AND PLAN / ED COURSE  I reviewed the triage vital signs and the nursing notes.   Differential diagnosis includes, but is not limited to, gastroenteritis, COVID, influenza, dehydration.    ----------------------------------------- 3:39 PM on 04/08/2023 ----------------------------------------- Patient reports no continued nausea, vomiting or diarrhea since being in the emergency department.  Will repeat serum potassium.  27 year old female presents to the ED with complaint of nausea, vomiting and diarrhea that began yesterday.  Workup included a COVID test  which was negative, WBC slightly elevated at 14.8 but otherwise unremarkable.  BMP showed a potassium of 2.6, glucose 151 and creatinine 1.03.  Patient was given normal saline 1 L and Zofran IV.  No further vomiting or diarrhea occurred while in the emergency department and she tolerated p.o. potassium without any difficulties.  Patient was given a p.o. challenge at her request.  Plans are to discharge her with a prescription for potassium that she is aware she needs to follow-up with her PCP and also Zofran as needed.  She will remain on a clear liquid diet remainder of today and slowly graduate her food intake as tolerated.      Patient's presentation is most consistent with acute illness / injury with system symptoms.  FINAL CLINICAL IMPRESSION(S) / ED DIAGNOSES   Final diagnoses:  Gastroenteritis  Hypokalemia     Rx / DC Orders   ED Discharge Orders          Ordered    ondansetron (ZOFRAN-ODT)  4 MG disintegrating tablet  Every 8 hours PRN        04/08/23 1547    potassium chloride SA (KLOR-CON M) 20 MEQ tablet  2 times daily        04/08/23 1547             Note:  This document was prepared using Dragon voice recognition software and may include unintentional dictation errors.   [name], PA-C 04/09/23 1154    [name], MD 04/11/23 [phone]

## 2023-11-18 ENCOUNTER — Emergency Department
Admission: EM | Admit: 2023-11-18 | Discharge: 2023-11-18 | Disposition: A | Discharge status: Home / Self Care | Attending: Emergency Medicine | Admitting: Emergency Medicine

## 2023-11-18 ENCOUNTER — Other Ambulatory Visit: Payer: Self-pay

## 2023-11-18 DIAGNOSIS — G43719 Chronic migraine without aura, intractable, without status migrainosus: Secondary | ICD-10-CM | POA: Diagnosis not present

## 2023-11-18 DIAGNOSIS — R519 Headache, unspecified: Secondary | ICD-10-CM | POA: Diagnosis present

## 2023-11-18 LAB — POC URINE PREG, ED: Preg Test, Ur: NEGATIVE

## 2023-11-18 MED ORDER — BUTALBITAL-APAP-CAFFEINE 50-325-40 MG PO TABS
1.0000 | ORAL_TABLET | Freq: Once | ORAL | Status: AC
  Administered 2023-11-18: 1 via ORAL
  Filled 2023-11-18: qty 1

## 2023-11-18 MED ORDER — ONDANSETRON 4 MG PO TBDP
4.0000 mg | ORAL_TABLET | Freq: Once | ORAL | Status: AC
  Administered 2023-11-18: 4 mg via ORAL
  Filled 2023-11-18: qty 1

## 2023-11-18 NOTE — Discharge Instructions (Signed)
 Please call neurology to make a follow-up appointment to discuss your headaches and return to the ER if you develop worsening symptoms or any other concerns

## 2023-11-18 NOTE — ED Notes (Signed)
Lt green and lavender top sent to lab 

## 2023-11-18 NOTE — ED Notes (Signed)
 Pt discharge information reviewed. Pt understands need for follow up care and when to return if symptoms worsen. All questions answered. Pt is alert and oriented with even and regular respirations. Pt is seen ambulating out of department with strong steady gait with friend.

## 2023-11-18 NOTE — ED Provider Notes (Signed)
 [facility] Provider Note    Event Date/Time   First MD Initiated Contact with Patient 11/18/23 2204     (approximate)   History   Headache   HPI  Margaret Crawford is a 28 y.o. female with history of migraines who comes in with concerns for headache.  Patient reports headaches that have been off and on for some time now.  She reports that she has had CT imaging before that have been negative and this feels exactly the same as her prior headaches.  She denies it being sudden or severe in onset, new vision changes or any other concerns associated with it.  She reports it being a 9 out of 10 and she has had headaches that have been similar quality and pain level as this before.  I did find a CT head from January 2024 as well as June 2021 that been negative  Physical Exam   Triage Vital Signs: ED Triage Vitals  Encounter Vitals Group     BP 11/18/23 2038 116/83     Systolic BP Percentile --      Diastolic BP Percentile --      Pulse Rate 11/18/23 2038 78     Resp 11/18/23 2038 18     Temp 11/18/23 2038 97.7 F (36.5 C)     Temp Source 11/18/23 2038 Oral     SpO2 11/18/23 2038 100 %     Weight 11/18/23 2036 117 lb (53.1 kg)     Height 11/18/23 2036 5\' 5"  (1.651 m)     Head Circumference --      Peak Flow --      Pain Score 11/18/23 2036 8     Pain Loc --      Pain Education --      Exclude from Growth Chart --     Most recent vital signs: Vitals:   11/18/23 2038  BP: 116/83  Pulse: 78  Resp: 18  Temp: 97.7 F (36.5 C)  SpO2: 100%     General: Awake, no distress.  CV:  Good peripheral perfusion.  Resp:  Normal effort.  Abd:  No distention.  Other:  Cranor 2 through 12 are intact.  Equal strength in arms and legs.  Pupils are equal round and reactive   ED Results / Procedures / Treatments   Labs (all labs ordered are listed, but only abnormal results are displayed) Labs Reviewed  POC URINE PREG, ED     EKG  My  interpretation of EKG:     PROCEDURES:  Critical Care performed: No  Procedures   MEDICATIONS ORDERED IN ED: Medications  butalbital-acetaminophen -caffeine (FIORICET) 50-325-40 MG per tablet 1 tablet (has no administration in time range)  ondansetron  (ZOFRAN -ODT) disintegrating tablet 4 mg (has no administration in time range)     IMPRESSION / MDM / ASSESSMENT AND PLAN / ED COURSE  I reviewed the triage vital signs and the nursing notes.   Patient's presentation is most consistent with acute presentation with potential threat to life or bodily function.   Patient comes in with headache.  Patient is chronic migraines.  States that she had a call out of work needs a work note.  Does request something for her headache we discussed CT imaging and she declined stating that she has had CT imaging in the past she just wants a work note something to help with the headache to go home.  She declines IV or IM medications.  Will give  a dose of Fioricet, Zofran  and have her follow-up outpatient with neurology.  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   Lubertha Rush, MD 11/18/23 2211

## 2023-11-18 NOTE — ED Triage Notes (Signed)
 Pt to ED via POV c/o headache. Pt reports it started this morning and has been hurting all day. Pt says she has a hx of headaches. Denies CP, SOB, fevers, dizziness

## 2024-04-28 ENCOUNTER — Ambulatory Visit

## 2024-04-28 DIAGNOSIS — Z113 Encounter for screening for infections with a predominantly sexual mode of transmission: Secondary | ICD-10-CM

## 2024-04-28 DIAGNOSIS — Z202 Contact with and (suspected) exposure to infections with a predominantly sexual mode of transmission: Secondary | ICD-10-CM

## 2024-04-28 DIAGNOSIS — A599 Trichomoniasis, unspecified: Secondary | ICD-10-CM

## 2024-04-28 LAB — WET PREP FOR TRICH, YEAST, CLUE
Clue Cell Exam: NEGATIVE
Trichomonas Exam: POSITIVE — AB
Yeast Exam: NEGATIVE

## 2024-04-28 MED ORDER — DOXYCYCLINE HYCLATE 100 MG PO TABS: 100.0000 mg | ORAL_TABLET | Freq: Two times a day (BID) | ORAL | Status: DC

## 2024-04-28 MED ORDER — CEFTRIAXONE SODIUM 500 MG IJ SOLR
500.0000 mg | Freq: Once | INTRAMUSCULAR | Status: AC
Start: 2024-04-28 — End: 2024-04-28
  Administered 2024-04-28: 500 mg via INTRAMUSCULAR

## 2024-04-28 MED ORDER — DOXYCYCLINE HYCLATE 100 MG PO TABS: 100.0000 mg | ORAL_TABLET | Freq: Two times a day (BID) | ORAL | Status: AC

## 2024-04-28 NOTE — Progress Notes (Signed)
 [facility] Department STI clinic [address], [address] [place] [address] Main phone: [phone]  STI screening visit  Subjective:  Margaret Crawford is a 28 y.o. female being seen today for an STI screening visit. The patient reports they do not have symptoms. She reports she has been a contact to gonorrhea and chlamydia.  Patient's last menstrual period was 04/18/2024 (approximate).  Patient has the following medical conditions:  Patient Active Problem List   Diagnosis Date Noted   Alcohol use daily 09/19/2020   Chief Complaint  Patient presents with   SEXUALLY TRANSMITTED DISEASE   HPI Patient reports exposure to gonorrhea and chlamydia. Was notified a few days ago by her partner. No symptoms. No other partners in last 2 months.  Does the patient using douching products? No  See flowsheet for further details and programmatic requirements Hyperlink available at the top of the signed note in blue.  Flow sheet content below:  Pregnancy Intention Screening Does the patient want to become pregnant in the next year?: No Does the patient's partner want to become pregnant in the next year?: No Would the patient like to discuss contraceptive options today?: No Reason For STD Screen STD Screening: Is a contact STD Symptoms Denies all: Yes Counseling Patient counseled to use condoms with all sex: Condoms given RTC in 2-3 weeks for test results: Yes Clinic will call if test results abnormal before test result appt.: Yes Test results given to patient Patient counseled to use condoms with all sex: Condoms given   Screening for MPX risk:  Unexplained rash?  No   MSM?  No   Multiple or anonymous sex partners?  No   Any close or sexual contact with a person  diagnosed with MPX?  No   Any outside the [place]  where MPX is endemic?  No   High clinical suspicion for MPX?    -Unlikely to be chickenpox    -Lymphadenopathy    -Rash that presents in same  phase of       evolution on any given body part  No   Screenings: Last HIV test per patient/review of record was No results found for: HMHIVSCREEN No results found for: HIV   Last HEPC test per patient/review of record was No results found for: HMHEPCSCREEN No components found for: HEPC   Last HEPB test per patient/review of record was No components found for: HMHEPBSCREEN   Patient reports last pap was:   No results found for: SPECADGYN No Cervical Cancer Screening results to display.  Immunization history:   There is no immunization history on file for this patient.  The following portions of the patient's history were reviewed and updated as appropriate: allergies, current medications, past medical history, past social history, past surgical history and problem list.  Objective:  There were no vitals filed for this visit.  Physical Exam Vitals and nursing note reviewed. Exam conducted with a chaperone present Brett Orange).  Constitutional:      Appearance: Normal appearance.  HENT:     Head: Normocephalic and atraumatic.     Mouth/Throat:     Mouth: Mucous membranes are moist.     Pharynx: Oropharynx is clear. No oropharyngeal exudate or posterior oropharyngeal erythema.  Pulmonary:     Effort: Pulmonary effort is normal.  Abdominal:     General: Abdomen is flat.     Palpations: There is no mass.     Tenderness: There is no abdominal tenderness. There is no  rebound.  Genitourinary:    General: Normal vulva.     Exam position: Lithotomy position.     Pubic Area: No rash or pubic lice.      Labia:        Right: No rash or lesion.        Left: No rash or lesion.      Vagina: Vaginal discharge present. No erythema, bleeding or lesions.     Cervix: No cervical motion tenderness, discharge, friability, lesion or erythema.     Comments: pH = not done due to vaginal spotting Mucus like, pink tinged discharge Lymphadenopathy:     Head:     Right side of head:  No preauricular or posterior auricular adenopathy.     Left side of head: No preauricular or posterior auricular adenopathy.     Cervical: No cervical adenopathy.     Upper Body:     Right upper body: No supraclavicular adenopathy.     Left upper body: No supraclavicular adenopathy.  Skin:    General: Skin is warm and dry.     Findings: No rash.  Neurological:     Mental Status: She is alert and oriented to person, place, and time.    Assessment and Plan:  Margaret Crawford is a 28 y.o. female presenting to the [facility] Department for STI screening  1. Screening for venereal disease (Primary)  - Chlamydia/Gonorrhea [facility] - WET PREP FOR TRICH, YEAST, CLUE - Declines all blood tests  2. Gonorrhea contact  - cefTRIAXone  (ROCEPHIN ) injection 500 mg  3. Chlamydia contact  - doxycycline  (VIBRA -TABS) 100 MG tablet; Take 1 tablet (100 mg total) by mouth 2 (two) times daily for 7 days.   Patient accepted the following screenings: vaginal CT/GC swab and vaginal wet prep Declines all blood tests.  Treat wet prep per standing order Discussed time line for [facility] results and that patient will be called with positive results and encouraged patient to call if she had not heard in 2 weeks.  Counseled to return or seek care for continued or worsening symptoms Recommended repeat testing in 3 months with positive results. Recommended condom use with all sex for STI prevention.   Return in about 3 months (around 07/29/2024).  No future appointments.  Damien FORBES Satchel, NP

## 2024-04-28 NOTE — Progress Notes (Signed)
 Pt is here for STD screening and a contact to Chlamydia and Gonorrhea. The patient was dispensed doxycyline 100 mg capsuless 2x/day for 7 days and Metronidazole  500 mg tablet 2x/day for days. Ceftriaxone  500 mg IM injection given at the [facility]  and patient tolerated well to injection. I provided counseling today regarding the medication, the side effects and when to call clinic. Patient was given the opportunity to ask questions for any clarifications. Questions answered. Brochure, contact card and condoms given. Wilkie Drought, RN.

## 2024-05-11 ENCOUNTER — Telehealth: Payer: Self-pay | Admitting: Family Medicine

## 2024-05-11 ENCOUNTER — Telehealth: Payer: Self-pay

## 2024-05-11 NOTE — Telephone Encounter (Signed)
 Attempted to contact pt to advise of positive test results from 04/28/24 for chlamydia/gonorrhea. Pt was treated as contact while in office dispensed Doxycyline 100mg  2x/day for 7days and administered Ceftriaxone  IM inj LUOQ. No answer. LMTRC.

## 2024-05-12 NOTE — Telephone Encounter (Signed)
 See call log Larraine JONELLE Novak, RN

## 2024-05-18 ENCOUNTER — Ambulatory Visit
